# Patient Record
Sex: Male | Born: 1990 | Race: Black or African American | Hispanic: No | Marital: Single | Smoking: Former smoker
Health system: Southern US, Community
[De-identification: ages and names within clinical notes are randomized; demographics above are authoritative.]

## PROBLEM LIST (undated history)

## (undated) DIAGNOSIS — F12188 Cannabis abuse with other cannabis-induced disorder: Secondary | ICD-10-CM

## (undated) DIAGNOSIS — F1910 Other psychoactive substance abuse, uncomplicated: Secondary | ICD-10-CM

---
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
## 2014-07-23 ENCOUNTER — Ambulatory Visit (INDEPENDENT_AMBULATORY_CARE_PROVIDER_SITE_OTHER): Payer: 59

## 2014-07-23 ENCOUNTER — Ambulatory Visit (INDEPENDENT_AMBULATORY_CARE_PROVIDER_SITE_OTHER): Payer: 59 | Admitting: Family Medicine

## 2014-07-23 ENCOUNTER — Ambulatory Visit: Payer: Self-pay

## 2014-07-23 VITALS — BP 122/80 | HR 70 | Temp 98.1°F | Ht 65.0 in | Wt 135.2 lb

## 2014-07-23 DIAGNOSIS — M25532 Pain in left wrist: Secondary | ICD-10-CM

## 2014-07-23 DIAGNOSIS — W19XXXA Unspecified fall, initial encounter: Secondary | ICD-10-CM

## 2014-07-23 DIAGNOSIS — M79642 Pain in left hand: Secondary | ICD-10-CM

## 2014-07-23 MED ORDER — HYDROCODONE-ACETAMINOPHEN 5-325 MG PO TABS: 1.0000 | ORAL_TABLET | Freq: Four times a day (QID) | ORAL | Status: DC | PRN

## 2014-07-23 NOTE — Patient Instructions (Signed)
I do not see any broken bones tonight. As your hand and wrist are sore in multiple areas, splint was placed for now. Keep arm elevated, clean over abrasions with soap and water once per day, cover with polysporin or vaseline, and  then place splint back on. Recheck with Dr. Neva SeatGreene on Friday.  If any worsening of pain, fingertips cold or color changes - go to emergency room.
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
## 2019-05-20 ENCOUNTER — Emergency Department (HOSPITAL_BASED_OUTPATIENT_CLINIC_OR_DEPARTMENT_OTHER)
Admission: EM | Admit: 2019-05-20 | Discharge: 2019-05-20 | Disposition: A | Discharge status: Home / Self Care | Payer: Self-pay | Attending: Emergency Medicine | Admitting: Emergency Medicine

## 2019-05-20 ENCOUNTER — Encounter (HOSPITAL_BASED_OUTPATIENT_CLINIC_OR_DEPARTMENT_OTHER): Payer: Self-pay | Admitting: *Deleted

## 2019-05-20 ENCOUNTER — Other Ambulatory Visit: Payer: Self-pay

## 2019-05-20 DIAGNOSIS — K2900 Acute gastritis without bleeding: Secondary | ICD-10-CM

## 2019-05-20 DIAGNOSIS — Z20828 Contact with and (suspected) exposure to other viral communicable diseases: Secondary | ICD-10-CM | POA: Insufficient documentation

## 2019-05-20 DIAGNOSIS — R112 Nausea with vomiting, unspecified: Secondary | ICD-10-CM

## 2019-05-20 LAB — CBC WITH DIFFERENTIAL/PLATELET
Abs Immature Granulocytes: 0.01 10*3/uL (ref 0.00–0.07)
Basophils Absolute: 0 10*3/uL (ref 0.0–0.1)
Basophils Relative: 1 %
Eosinophils Absolute: 0.3 10*3/uL (ref 0.0–0.5)
Eosinophils Relative: 5 %
HCT: 50.2 % (ref 39.0–52.0)
Hemoglobin: 16.6 g/dL (ref 13.0–17.0)
Immature Granulocytes: 0 %
Lymphocytes Relative: 30 %
Lymphs Abs: 1.9 10*3/uL (ref 0.7–4.0)
MCH: 27.4 pg (ref 26.0–34.0)
MCHC: 33.1 g/dL (ref 30.0–36.0)
MCV: 83 fL (ref 80.0–100.0)
Monocytes Absolute: 0.4 10*3/uL (ref 0.1–1.0)
Monocytes Relative: 6 %
Neutro Abs: 3.6 10*3/uL (ref 1.7–7.7)
Neutrophils Relative %: 58 %
Platelets: 267 10*3/uL (ref 150–400)
RBC: 6.05 MIL/uL — ABNORMAL HIGH (ref 4.22–5.81)
RDW: 14.6 % (ref 11.5–15.5)
WBC: 6.2 10*3/uL (ref 4.0–10.5)
nRBC: 0 % (ref 0.0–0.2)

## 2019-05-20 LAB — COMPREHENSIVE METABOLIC PANEL
ALT: 18 U/L (ref 0–44)
AST: 27 U/L (ref 15–41)
Albumin: 4.7 g/dL (ref 3.5–5.0)
Alkaline Phosphatase: 83 U/L (ref 38–126)
Anion gap: 11 (ref 5–15)
BUN: 12 mg/dL (ref 6–20)
CO2: 27 mmol/L (ref 22–32)
Calcium: 9.9 mg/dL (ref 8.9–10.3)
Chloride: 101 mmol/L (ref 98–111)
Creatinine, Ser: 0.97 mg/dL (ref 0.61–1.24)
GFR calc Af Amer: >60 mL/min (ref >60)
GFR calc non Af Amer: >60 mL/min (ref >60)
Glucose, Bld: 98 mg/dL (ref 70–99)
Potassium: 3.4 mmol/L — ABNORMAL LOW (ref 3.5–5.1)
Sodium: 139 mmol/L (ref 135–145)
Total Bilirubin: 0.5 mg/dL (ref 0.3–1.2)
Total Protein: 8.5 g/dL — ABNORMAL HIGH (ref 6.5–8.1)

## 2019-05-20 LAB — LIPASE, BLOOD: Lipase: 87 U/L — ABNORMAL HIGH (ref 11–51)

## 2019-05-20 MED ORDER — HALOPERIDOL LACTATE 5 MG/ML IJ SOLN
5.0000 mg | Freq: Once | INTRAMUSCULAR | Status: AC
  Administered 2019-05-20: 15:00:00 5 mg via INTRAVENOUS
  Filled 2019-05-20: qty 1

## 2019-05-20 MED ORDER — SODIUM CHLORIDE 0.9 % IV BOLUS
1000.0000 mL | Freq: Once | INTRAVENOUS | Status: AC
  Administered 2019-05-20: 1000 mL via INTRAVENOUS

## 2019-05-20 MED ORDER — ONDANSETRON 4 MG PO TBDP: 4.0000 mg | ORAL_TABLET | Freq: Three times a day (TID) | ORAL | 0 refills | Status: DC | PRN

## 2019-05-20 MED ORDER — MORPHINE SULFATE (PF) 4 MG/ML IV SOLN: 4.0000 mg | Freq: Once | INTRAVENOUS | Status: DC

## 2019-05-20 MED ORDER — FAMOTIDINE 20 MG PO TABS: 20.0000 mg | ORAL_TABLET | Freq: Two times a day (BID) | ORAL | 0 refills | Status: DC

## 2019-05-20 MED ORDER — LORAZEPAM 2 MG/ML IJ SOLN
1.0000 mg | Freq: Once | INTRAMUSCULAR | Status: AC
  Administered 2019-05-20: 14:00:00 1 mg via INTRAVENOUS
  Filled 2019-05-20: qty 1

## 2019-05-20 MED ORDER — CAPSAICIN 0.025 % EX CREA
TOPICAL_CREAM | Freq: Once | CUTANEOUS | Status: AC
  Administered 2019-05-20: 15:00:00 via TOPICAL
  Filled 2019-05-20: qty 60

## 2019-05-20 MED ORDER — FAMOTIDINE IN NACL 20-0.9 MG/50ML-% IV SOLN
20.0000 mg | INTRAVENOUS | Status: AC
  Administered 2019-05-20: 20 mg via INTRAVENOUS
  Filled 2019-05-20: qty 50

## 2019-05-20 NOTE — ED Triage Notes (Signed)
Abdominal pain and vomiting. Mariajuana last used was yesterday.

## 2019-05-20 NOTE — ED Notes (Signed)
Pt resting quietly with eyes closed.  No further vomiting at this time.

## 2019-05-20 NOTE — ED Notes (Signed)
Pt tolerating po fluids, no further vomiting, mom here for ride home.

## 2019-05-20 NOTE — ED Notes (Signed)
Pt is uncooperative at triage.

## 2019-05-20 NOTE — ED Notes (Signed)
Pt. John Beltran he had already answered questions and will not answer questions asked by RN

## 2019-05-20 NOTE — ED Notes (Signed)
Pt. Actively vomiting at present time.

## 2019-05-20 NOTE — ED Provider Notes (Signed)
  Physical Exam  BP (!) 140/98   Pulse 89   Temp 98.5 F (36.9 C) (Oral)   Resp 14   Ht 5\' 7"  (1.702 m)   SpO2 100%   BMI 21.18 kg/m   Physical Exam  ED Course/Procedures     Procedures  MDM   Received care of patient at 3:30 PM from Dr. Rex Kras.  Please see her note for prior history, physical and care.  Briefly, this is a 28 year old male who presented with concern for nausea, vomiting abdominal pain.  Use marijuana yesterday.  Abdominal exam benign at this time, low suspicion for appendicitis, SBO, diverticulitis, pyelonephritis, cholecystitis.  Labs within normal limits, with lipase elevation that is not consistent with pancreatitis.  Given IV fluids, capsaicin, Ativan, and Haldol.  Plan is for reevaluation after these medications and p.o. challenge.  No focal abdominal pain on my repeat exam. Is tolerating po. Possible cannabinoid hyperemesis versus gastritis/viral etiology.  Given rx for zofran. COVID19 testing sent. Recommend supportive care.     John Morgan, MD 05/21/19 1258

## 2019-05-20 NOTE — ED Notes (Signed)
Pt. Reports he smokes a lot of mariajuana

## 2019-05-20 NOTE — ED Provider Notes (Signed)
[facility] EMERGENCY DEPARTMENT Provider Note   CSN: [id number] Arrival date & time: 05/20/19  1318     History Chief Complaint  Patient presents with  . Abdominal Pain  . Emesis    John Beltran is a 28 y.o. male.  28 year old male who presents with vomiting.  Patient states that he has a longstanding history of vomiting that has been getting worse recently.  He reports associated abdominal pain which he describes as "it feels like there is a rock in my stomach." He wonders whether pain is related to reflux or ulcers. Nothing makes symptoms better or worse. He does not take any medications for acid reflux.  He denies any associated diarrhea, cough, or fevers.  He denies alcohol or NSAID use but does admit to frequent marijuana use.  The history is provided by the patient.  Abdominal Pain Associated symptoms: vomiting   Emesis Associated symptoms: abdominal pain        History reviewed. No pertinent past medical history.  Patient Active Problem List   Diagnosis Date Noted  . ALLERGIC RHINITIS 09/29/2009    History reviewed. No pertinent surgical history.     No family history on file.  Social History   Tobacco Use  . Smoking status: Never Smoker  . Smokeless tobacco: Never Used  Substance Use Topics  . Alcohol use: No    Alcohol/week: 0.0 standard drinks  . Drug use: Yes    Types: Marijuana    Home Medications Prior to Admission medications   Medication Sig Start Date End Date Taking? Authorizing Provider  HYDROcodone-acetaminophen (NORCO/VICODIN) 5-325 MG per tablet Take 1-2 tablets by mouth every 6 (six) hours as needed for moderate pain. 07/23/14   Shade Flood, MD    Allergies    Patient has no known allergies.  Review of Systems   Review of Systems  Gastrointestinal: Positive for abdominal pain and vomiting.   All other systems reviewed and are negative except that which was mentioned in HPI  Physical Exam Updated Vital  Signs BP (!) 140/98   Pulse 89   Temp 98.5 F (36.9 C) (Oral)   Resp 14   Ht 5\' 7"  (1.702 m)   SpO2 100%   BMI 21.18 kg/m   Physical Exam Vitals and nursing note reviewed.  Constitutional:      Appearance: He is well-developed.     Comments: Uncomfortable, retching  HENT:     Head: Normocephalic and atraumatic.  Eyes:     Conjunctiva/sclera: Conjunctivae normal.     Pupils: Pupils are equal, round, and reactive to light.  Cardiovascular:     Rate and Rhythm: Normal rate and regular rhythm.     Pulses: Normal pulses.  Pulmonary:     Effort: Pulmonary effort is normal.  Abdominal:     General: Bowel sounds are normal. There is no distension.     Palpations: Abdomen is soft.     Tenderness: There is no abdominal tenderness.  Musculoskeletal:     Cervical back: Neck supple.  Skin:    General: Skin is warm and dry.  Neurological:     Mental Status: He is alert and oriented to person, place, and time.     Comments: Fluent speech  Psychiatric:        Judgment: Judgment normal.     ED Results / Procedures / Treatments   Labs (all labs ordered are listed, but only abnormal results are displayed) Labs Reviewed  COMPREHENSIVE METABOLIC PANEL -  Abnormal; Notable for the following components:      Result Value   Potassium 3.4 (*)    Total Protein 8.5 (*)    All other components within normal limits  LIPASE, BLOOD - Abnormal; Notable for the following components:   Lipase 87 (*)    All other components within normal limits  CBC WITH DIFFERENTIAL/PLATELET - Abnormal; Notable for the following components:   RBC 6.05 (*)    All other components within normal limits    EKG None  Radiology No results found.  Procedures Procedures (including critical care time)  Medications Ordered in ED Medications  famotidine (PEPCID) IVPB 20 mg premix (has no administration in time range)  LORazepam (ATIVAN) injection 1 mg (1 mg Intravenous Given 05/20/19 1427)  capsaicin  (ZOSTRIX) 0.025 % cream ( Topical Given 05/20/19 1433)  sodium chloride 0.9 % bolus 1,000 mL (1,000 mLs Intravenous New Bag/Given 05/20/19 1434)  haloperidol lactate (HALDOL) injection 5 mg (5 mg Intravenous Given 05/20/19 1454)    ED Course  I have reviewed the triage vital signs and the nursing notes.  Pertinent labs that were available during my care of the patient were reviewed by me and considered in my medical decision making (see chart for details).    MDM Rules/Calculators/A&P                       Pt vomiting during my initial exam. Given chronicity of symptoms, suspect condition such as gastritis or cyclical vomiting 2/2 marijuana rather than acute intraabdominal process such as appendicitis or bowel obstruction. Gave ativan and capsaicin as well as fluid bolus.  Patient continued to vomit therefore ordered IV Haldol followed by Pepcid.  On reassessment, the patient was resting comfortably.  He was not very cooperative with my abdominal exam but I was eventually able to examine abdomen and noted no focal areas of tenderness.  Lab work is overall reassuring with normal LFTs, normal CBC, normal creatinine.  Very mild elevation of lipase at 87.  Patient denies alcohol use.  Mom reports history of acid reflux problems and it is possible his symptoms are related to gastritis/PUD.  After patient has rested, we will attempt p.o. challenge.  I am signing out to oncoming provider pending reassessment and clinical improvement.  Final Clinical Impression(s) / ED Diagnoses Final diagnoses:  None    Rx / DC Orders ED Discharge Orders    None       Graceland Wachter, Wenda Overland, MD 05/20/19 1525

## 2019-05-20 NOTE — ED Notes (Signed)
Per mother Pt. Has been complaining of being extremely tired yesterday and the vomiting started today.

## 2019-05-21 ENCOUNTER — Emergency Department (HOSPITAL_COMMUNITY)
Admission: EM | Admit: 2019-05-21 | Discharge: 2019-05-21 | Disposition: A | Discharge status: Home / Self Care | Payer: Self-pay | Attending: Emergency Medicine | Admitting: Emergency Medicine

## 2019-05-21 ENCOUNTER — Encounter (HOSPITAL_COMMUNITY): Payer: Self-pay | Admitting: Emergency Medicine

## 2019-05-21 ENCOUNTER — Other Ambulatory Visit: Payer: Self-pay

## 2019-05-21 DIAGNOSIS — F1210 Cannabis abuse, uncomplicated: Secondary | ICD-10-CM | POA: Insufficient documentation

## 2019-05-21 DIAGNOSIS — R1013 Epigastric pain: Secondary | ICD-10-CM | POA: Insufficient documentation

## 2019-05-21 DIAGNOSIS — R112 Nausea with vomiting, unspecified: Secondary | ICD-10-CM | POA: Insufficient documentation

## 2019-05-21 MED ORDER — LIDOCAINE VISCOUS HCL 2 % MT SOLN
15.0000 mL | Freq: Once | OROMUCOSAL | Status: AC
  Administered 2019-05-21: 06:00:00 15 mL via ORAL
  Filled 2019-05-21: qty 15

## 2019-05-21 MED ORDER — DIPHENHYDRAMINE HCL 50 MG/ML IJ SOLN
25.0000 mg | Freq: Once | INTRAMUSCULAR | Status: AC
  Administered 2019-05-21: 25 mg via INTRAMUSCULAR
  Filled 2019-05-21: qty 1

## 2019-05-21 MED ORDER — DROPERIDOL 2.5 MG/ML IJ SOLN: 2.5000 mg | Freq: Once | INTRAMUSCULAR | Status: DC

## 2019-05-21 MED ORDER — ALUM & MAG HYDROXIDE-SIMETH 200-200-20 MG/5ML PO SUSP
30.0000 mL | Freq: Once | ORAL | Status: AC
  Administered 2019-05-21: 30 mL via ORAL
  Filled 2019-05-21: qty 30

## 2019-05-21 MED ORDER — DICYCLOMINE HCL 10 MG/5ML PO SOLN
10.0000 mg | Freq: Once | ORAL | Status: AC
  Administered 2019-05-21: 10 mg via ORAL
  Filled 2019-05-21: qty 5

## 2019-05-21 MED ORDER — DROPERIDOL 2.5 MG/ML IJ SOLN
1.2500 mg | Freq: Once | INTRAMUSCULAR | Status: AC
  Administered 2019-05-21: 1.25 mg via INTRAMUSCULAR
  Filled 2019-05-21: qty 2

## 2019-05-21 NOTE — Discharge Instructions (Signed)
Try zantac or pepcid twice a day.  Try to avoid things that may make this worse, most commonly these are spicy foods tomato based products fatty foods chocolate and peppermint.  Alcohol and tobacco can also make this worse.  Return to the emergency department for sudden worsening pain fever or inability to eat or drink. ° °

## 2019-05-21 NOTE — ED Triage Notes (Signed)
Patient is complaining of abdominal pain, nausea, and vomiting. Patient states that he ate a chicken sandwich yesterday and he think that is what started these symptoms.

## 2019-05-21 NOTE — ED Provider Notes (Signed)
[facility]-EMERGENCY DEPT Provider Note   CSN: [id number] Arrival date & time: 05/21/19  0436     History Chief Complaint  Patient presents with  . Abdominal Pain    John Beltran is a 28 y.o. male.  28 yo M with a chief complaints of epigastric abdominal pain nausea and vomiting.  Going on for a month or worsening over the past few days.  Was seen at [facility] about 7 hours ago.  Had lab work done and was discharged.  He says that nothing was done for him there.  He is requesting an x-ray of his abdomen.  The history is provided by the patient.  Abdominal Pain Pain location:  Epigastric Pain quality: sharp and shooting   Associated symptoms: nausea and vomiting   Associated symptoms: no chest pain, no chills, no diarrhea, no fever and no shortness of breath        History reviewed. No pertinent past medical history.  Patient Active Problem List   Diagnosis Date Noted  . ALLERGIC RHINITIS 09/29/2009    History reviewed. No pertinent surgical history.     History reviewed. No pertinent family history.  Social History   Tobacco Use  . Smoking status: Never Smoker  . Smokeless tobacco: Never Used  Substance Use Topics  . Alcohol use: No    Alcohol/week: 0.0 standard drinks  . Drug use: Yes    Types: Marijuana    Home Medications Prior to Admission medications   Medication Sig Start Date End Date Taking? Authorizing Provider  famotidine (PEPCID) 20 MG tablet Take 1 tablet (20 mg total) by mouth 2 (two) times daily. 05/20/19   Alvira Monday, MD  HYDROcodone-acetaminophen (NORCO/VICODIN) 5-325 MG per tablet Take 1-2 tablets by mouth every 6 (six) hours as needed for moderate pain. 07/23/14   Shade Flood, MD  ondansetron (ZOFRAN ODT) 4 MG disintegrating tablet Take 1 tablet (4 mg total) by mouth every 8 (eight) hours as needed for nausea or vomiting. 05/20/19   Alvira Monday, MD    Allergies    Patient has no  known allergies.  Review of Systems   Review of Systems  Constitutional: Negative for chills and fever.  HENT: Negative for congestion and facial swelling.   Eyes: Negative for discharge and visual disturbance.  Respiratory: Negative for shortness of breath.   Cardiovascular: Negative for chest pain and palpitations.  Gastrointestinal: Positive for abdominal pain, nausea and vomiting. Negative for diarrhea.  Musculoskeletal: Negative for arthralgias and myalgias.  Skin: Negative for color change and rash.  Neurological: Negative for tremors, syncope and headaches.  Psychiatric/Behavioral: Negative for confusion and dysphoric mood.    Physical Exam Updated Vital Signs BP 126/84 (BP Location: Right Arm)   Pulse 60   Temp 98 F (36.7 C) (Oral)   Resp 14   Ht 5\' 3"  (1.6 m)   Wt 77.1 kg   SpO2 100%   BMI 30.11 kg/m   Physical Exam Vitals and nursing note reviewed.  Constitutional:      Appearance: He is well-developed.  HENT:     Head: Normocephalic and atraumatic.  Eyes:     Pupils: Pupils are equal, round, and reactive to light.  Neck:     Vascular: No JVD.  Cardiovascular:     Rate and Rhythm: Normal rate and regular rhythm.     Heart sounds: No murmur. No friction rub. No gallop.   Pulmonary:     Effort: No respiratory  distress.     Breath sounds: No wheezing.  Abdominal:     General: There is no distension.     Tenderness: There is abdominal tenderness (worse to the epigastrium). There is no guarding or rebound.  Musculoskeletal:        General: Normal range of motion.     Cervical back: Normal range of motion and neck supple.  Skin:    Coloration: Skin is not pale.     Findings: No rash.  Neurological:     Mental Status: He is alert and oriented to person, place, and time.  Psychiatric:        Behavior: Behavior normal.     ED Results / Procedures / Treatments   Labs (all labs ordered are listed, but only abnormal results are displayed) Labs Reviewed -  No data to display  EKG None  Radiology No results found.  Procedures Procedures (including critical care time)  Medications Ordered in ED Medications  dicyclomine (BENTYL) 10 MG/5ML solution 10 mg (10 mg Oral Given 05/21/19 0613)  alum & mag hydroxide-simeth (MAALOX/MYLANTA) 200-200-20 MG/5ML suspension 30 mL (30 mLs Oral Given 05/21/19 0612)    And  lidocaine (XYLOCAINE) 2 % viscous mouth solution 15 mL (15 mLs Oral Given 05/21/19 0612)  diphenhydrAMINE (BENADRYL) injection 25 mg (25 mg Intramuscular Given 05/21/19 0613)  droperidol (INAPSINE) 2.5 MG/ML injection 1.25 mg (1.25 mg Intramuscular Given 05/21/19 2683)    ED Course  I have reviewed the triage vital signs and the nursing notes.  Pertinent labs & imaging results that were available during my care of the patient were reviewed by me and considered in my medical decision making (see chart for details).    MDM Rules/Calculators/A&P                      28 yo M with a chief complaints of epigastric abdominal pain nausea and vomiting.  Patient's was just seen at [facility].  Had a laboratory evaluation with mild elevation of the lipase.  He was able to tolerate p.o. and was discharged home.  Patient is requesting a CT scan.  Clinically I am not sure that 1 is indicated.  Patient has had pain for greater than a month, history of significant marijuana abuse.  Has had chronic nausea and vomiting.  Without a significant lab abnormality I think his best course of action would be to see a gastroenterologist as an outpatient.  He has not had any vomiting here.  We will give him a dose of nausea medicine, GI cocktail reassess.  Patient tolerating PO.  Symptoms improved.  D/c home.   6:59 AM:  I have discussed the diagnosis/risks/treatment options with the patient and believe the pt to be eligible for discharge home to follow-up with PCP. We also discussed returning to the ED immediately if new or worsening sx occur. We  discussed the sx which are most concerning (e.g., sudden worsening pain, fever, inability to tolerate by mouth) that necessitate immediate return. Medications administered to the patient during their visit and any new prescriptions provided to the patient are listed below.  Medications given during this visit Medications  dicyclomine (BENTYL) 10 MG/5ML solution 10 mg (10 mg Oral Given 05/21/19 0613)  alum & mag hydroxide-simeth (MAALOX/MYLANTA) 200-200-20 MG/5ML suspension 30 mL (30 mLs Oral Given 05/21/19 0612)    And  lidocaine (XYLOCAINE) 2 % viscous mouth solution 15 mL (15 mLs Oral Given 05/21/19 0612)  diphenhydrAMINE (BENADRYL) injection  25 mg (25 mg Intramuscular Given 05/21/19 0613)  droperidol (INAPSINE) 2.5 MG/ML injection 1.25 mg (1.25 mg Intramuscular Given 05/21/19 [id number])     The patient appears reasonably screen and/or stabilized for discharge and I doubt any other medical condition or other [facility] requiring further screening, evaluation, or treatment in the ED at this time prior to discharge.   Final Clinical Impression(s) / ED Diagnoses Final diagnoses:  Epigastric pain    Rx / DC Orders ED Discharge Orders    None       Melene PlanFloyd, Armanda Forand, DO 05/21/19 0700

## 2019-05-22 ENCOUNTER — Ambulatory Visit: Payer: Self-pay | Admitting: *Deleted

## 2019-05-22 LAB — NOVEL CORONAVIRUS, NAA (HOSP ORDER, SEND-OUT TO REF LAB; TAT 18-24 HRS): SARS-CoV-2, NAA: NOT DETECTED

## 2019-05-22 NOTE — Telephone Encounter (Signed)
Patient seen at the ED twice in the last 3 days for vomiting. Call is from his mother. She reported he had a significant amount of marijuana prior to the vomiting that began Monday. Complaining of abdominal pain near the umbilicus since the vomiting started. He was discharged from ED with anti-nausea medications which she gave him last night and this morning. Reported his vomiting eased some earlier today and now has began again. Denies blood/coffee ground appearance in emesis. Denies diarrhea. Care Advice including only sips of Pedialyte per hour for the next 12 hours, no solids at all to let his gut rest. Stretch out and deep breath often today to relax his abdomen muscles from all the vomiting. If no improvement over 24 hours or worsens, seek treatment again at [facility] or ED. Call back if needed, we will try to establish a pcp for patient. Reviewed urgent symptoms to call back with or seek immediate treatment at the ED.   Reason for Disposition . Alcohol or drug abuse, known or suspected . Vomiting is a chronic symptom (recurrent or ongoing AND present > 4 weeks)  Answer Assessment - Initial Assessment Questions 1. VOMITING SEVERITY: "How many times have you vomited in the past 24 hours?"     - MILD:  1 - 2 times/day    - MODERATE: 3 - 5 times/day, decreased oral intake without significant weight loss or symptoms of dehydration    - SEVERE: 6 or more times/day, vomits everything or nearly everything, with significant weight loss, symptoms of dehydration     2. ONSET: "When did the vomiting begin?"     moderate 3. FLUIDS: "What fluids or food have you vomited up today?" "Have you been able to keep any fluids down?"    Crackers and fluids 4. ABDOMINAL PAIN: "Are your having any abdominal pain?" If yes : "How bad is it and what does it feel like?" (e.g., crampy, dull, intermittent, constant)      Pain near the umbilicus  5. DIARRHEA: "Is there any diarrhea?" If so, ask: "How many times today?"      none 6. CONTACTS: "Is there anyone else in the family with the same symptoms?"      none 7. CAUSE: "What do you think is causing your vomiting?"    Possible marijuana use 8. HYDRATION STATUS: "Any signs of dehydration?" (e.g., dry mouth [not only dry lips], too weak to stand) "When did you last urinate?"     unsure 9. OTHER SYMPTOMS: "Do you have any other symptoms?" (e.g., fever, headache, vertigo, vomiting blood or coffee grounds, recent head injury)     no 10. PREGNANCY: "Is there any chance you are pregnant?" "When was your last menstrual period?"       na  Protocols used: [facility]

## 2019-05-25 ENCOUNTER — Encounter (HOSPITAL_BASED_OUTPATIENT_CLINIC_OR_DEPARTMENT_OTHER): Payer: Self-pay | Admitting: Emergency Medicine

## 2019-05-25 ENCOUNTER — Emergency Department (HOSPITAL_BASED_OUTPATIENT_CLINIC_OR_DEPARTMENT_OTHER)
Admission: EM | Admit: 2019-05-25 | Discharge: 2019-05-25 | Disposition: A | Discharge status: Home / Self Care | Payer: Self-pay | Attending: Emergency Medicine | Admitting: Emergency Medicine

## 2019-05-25 ENCOUNTER — Other Ambulatory Visit: Payer: Self-pay

## 2019-05-25 DIAGNOSIS — E860 Dehydration: Secondary | ICD-10-CM | POA: Insufficient documentation

## 2019-05-25 DIAGNOSIS — R112 Nausea with vomiting, unspecified: Secondary | ICD-10-CM | POA: Insufficient documentation

## 2019-05-25 LAB — URINALYSIS, ROUTINE W REFLEX MICROSCOPIC
Bilirubin Urine: NEGATIVE
Glucose, UA: NEGATIVE mg/dL
Hgb urine dipstick: NEGATIVE
Ketones, ur: 40 mg/dL — AB
Leukocytes,Ua: NEGATIVE
Nitrite: NEGATIVE
Protein, ur: NEGATIVE mg/dL
Specific Gravity, Urine: 1.015 (ref 1.005–1.030)
pH: 7 (ref 5.0–8.0)

## 2019-05-25 LAB — BASIC METABOLIC PANEL
Anion gap: 11 (ref 5–15)
BUN: 33 mg/dL — ABNORMAL HIGH (ref 6–20)
CO2: 31 mmol/L (ref 22–32)
Calcium: 8.9 mg/dL (ref 8.9–10.3)
Chloride: 91 mmol/L — ABNORMAL LOW (ref 98–111)
Creatinine, Ser: 1.28 mg/dL — ABNORMAL HIGH (ref 0.61–1.24)
GFR calc Af Amer: >60 mL/min (ref >60)
GFR calc non Af Amer: >60 mL/min (ref >60)
Glucose, Bld: 92 mg/dL (ref 70–99)
Potassium: 3.1 mmol/L — ABNORMAL LOW (ref 3.5–5.1)
Sodium: 133 mmol/L — ABNORMAL LOW (ref 135–145)

## 2019-05-25 LAB — CBC WITH DIFFERENTIAL/PLATELET
Abs Immature Granulocytes: 0.01 10*3/uL (ref 0.00–0.07)
Basophils Absolute: 0 10*3/uL (ref 0.0–0.1)
Basophils Relative: 0 %
Eosinophils Absolute: 0 10*3/uL (ref 0.0–0.5)
Eosinophils Relative: 0 %
HCT: 57.7 % — ABNORMAL HIGH (ref 39.0–52.0)
Hemoglobin: 19.4 g/dL — ABNORMAL HIGH (ref 13.0–17.0)
Immature Granulocytes: 0 %
Lymphocytes Relative: 29 %
Lymphs Abs: 1.6 10*3/uL (ref 0.7–4.0)
MCH: 26.9 pg (ref 26.0–34.0)
MCHC: 33.6 g/dL (ref 30.0–36.0)
MCV: 80.1 fL (ref 80.0–100.0)
Monocytes Absolute: 0.6 10*3/uL (ref 0.1–1.0)
Monocytes Relative: 11 %
Neutro Abs: 3.3 10*3/uL (ref 1.7–7.7)
Neutrophils Relative %: 60 %
Platelets: 308 10*3/uL (ref 150–400)
RBC: 7.2 MIL/uL — ABNORMAL HIGH (ref 4.22–5.81)
RDW: 14.6 % (ref 11.5–15.5)
WBC: 5.6 10*3/uL (ref 4.0–10.5)
nRBC: 0 % (ref 0.0–0.2)

## 2019-05-25 LAB — COMPREHENSIVE METABOLIC PANEL
ALT: 20 U/L (ref 0–44)
AST: 26 U/L (ref 15–41)
Albumin: 5.1 g/dL — ABNORMAL HIGH (ref 3.5–5.0)
Alkaline Phosphatase: 88 U/L (ref 38–126)
Anion gap: >20 — ABNORMAL HIGH (ref 5–15)
BUN: 45 mg/dL — ABNORMAL HIGH (ref 6–20)
CO2: 30 mmol/L (ref 22–32)
Calcium: 9.9 mg/dL (ref 8.9–10.3)
Chloride: 82 mmol/L — ABNORMAL LOW (ref 98–111)
Creatinine, Ser: 1.67 mg/dL — ABNORMAL HIGH (ref 0.61–1.24)
GFR calc Af Amer: >60 mL/min (ref >60)
GFR calc non Af Amer: 56 mL/min — ABNORMAL LOW (ref >60)
Glucose, Bld: 106 mg/dL — ABNORMAL HIGH (ref 70–99)
Potassium: 3.1 mmol/L — ABNORMAL LOW (ref 3.5–5.1)
Sodium: 133 mmol/L — ABNORMAL LOW (ref 135–145)
Total Bilirubin: 2.1 mg/dL — ABNORMAL HIGH (ref 0.3–1.2)
Total Protein: 9.1 g/dL — ABNORMAL HIGH (ref 6.5–8.1)

## 2019-05-25 LAB — LIPASE, BLOOD: Lipase: 19 U/L (ref 11–51)

## 2019-05-25 LAB — TROPONIN I (HIGH SENSITIVITY): Troponin I (High Sensitivity): 5 ng/L (ref <18)

## 2019-05-25 MED ORDER — LACTATED RINGERS IV BOLUS
1000.0000 mL | Freq: Once | INTRAVENOUS | Status: AC
  Administered 2019-05-25: 10:00:00 1000 mL via INTRAVENOUS

## 2019-05-25 MED ORDER — DROPERIDOL 2.5 MG/ML IJ SOLN: 2.5000 mg | Freq: Once | INTRAMUSCULAR | Status: DC

## 2019-05-25 MED ORDER — SODIUM CHLORIDE 0.9 % IV BOLUS
1000.0000 mL | Freq: Once | INTRAVENOUS | Status: AC
  Administered 2019-05-25: 09:00:00 1000 mL via INTRAVENOUS

## 2019-05-25 MED ORDER — POTASSIUM CHLORIDE CRYS ER 20 MEQ PO TBCR
40.0000 meq | EXTENDED_RELEASE_TABLET | Freq: Once | ORAL | Status: AC
  Administered 2019-05-25: 14:00:00 40 meq via ORAL
  Filled 2019-05-25: qty 2

## 2019-05-25 MED ORDER — CAPSAICIN 0.075 % EX CREA: TOPICAL_CREAM | Freq: Two times a day (BID) | CUTANEOUS | Status: DC

## 2019-05-25 MED ORDER — ONDANSETRON HCL 4 MG PO TABS: 4.0000 mg | ORAL_TABLET | Freq: Four times a day (QID) | ORAL | 0 refills | Status: DC

## 2019-05-25 MED ORDER — ONDANSETRON HCL 4 MG/2ML IJ SOLN
4.0000 mg | Freq: Once | INTRAMUSCULAR | Status: AC
  Administered 2019-05-25: 4 mg via INTRAVENOUS
  Filled 2019-05-25: qty 2

## 2019-05-25 NOTE — ED Provider Notes (Signed)
[facility] EMERGENCY DEPARTMENT Provider Note   CSN: [id number] Arrival date & time: 05/25/19  0757     History Chief Complaint  Patient presents with  . Abdominal Pain  . Emesis    John Beltran is a 28 y.o. male.  The history is provided by the patient and medical records. No language interpreter was used.  Abdominal Pain Associated symptoms: vomiting   Emesis Associated symptoms: abdominal pain    John Beltran is a 28 y.o. male who presents to the Emergency Department complaining of abdominal pain and vomiting. He presents the emergency department complaining of five days of nausea, vomiting and generalized abdominal pain. Symptoms began abruptly after eating [employer] sandwich. He reports vomiting up to 20 times daily. He has decreased urinary and stool output. No fevers. He does report using marijuana but has not used for the last five days. He also takes occasional oxycodone's and did take one 5 days ago. No IV drug use. No alcohol use. He states that sitting in a hot tub does help his pain. He has no medical problems and takes no medications. No prior abdominal surgeries. Recently had negative covid test. No cough, sob.  No loss of taste/smell.      History reviewed. No pertinent past medical history.  Patient Active Problem List   Diagnosis Date Noted  . ALLERGIC RHINITIS 09/29/2009    History reviewed. No pertinent surgical history.     No family history on file.  Social History   Tobacco Use  . Smoking status: Never Smoker  . Smokeless tobacco: Never Used  Substance Use Topics  . Alcohol use: No    Alcohol/week: 0.0 standard drinks  . Drug use: Yes    Types: Marijuana    Home Medications Prior to Admission medications   Medication Sig Start Date End Date Taking? Authorizing Provider  famotidine (PEPCID) 20 MG tablet Take 1 tablet (20 mg total) by mouth 2 (two) times daily. 05/20/19   Alvira MondaySchlossman, Erin, MD  ondansetron (ZOFRAN) 4 MG tablet  Take 1 tablet (4 mg total) by mouth every 6 (six) hours. 05/25/19   Tilden Fossaees, Philana Younis, MD    Allergies    Patient has no known allergies.  Review of Systems   Review of Systems  Gastrointestinal: Positive for abdominal pain and vomiting.  All other systems reviewed and are negative.   Physical Exam Updated Vital Signs BP 129/81   Pulse 70   Temp (!) 97.5 F (36.4 C) (Oral)   Resp 13   Ht 5\' 6"  (1.676 m)   Wt 68 kg Comment: Pt. was not sure of their weight  SpO2 100%   BMI 24.21 kg/m   Physical Exam Vitals and nursing note reviewed.  Constitutional:      Appearance: He is well-developed.  HENT:     Head: Normocephalic and atraumatic.  Cardiovascular:     Rate and Rhythm: Normal rate and regular rhythm.  Pulmonary:     Effort: Pulmonary effort is normal. No respiratory distress.  Abdominal:     Palpations: Abdomen is soft.     Tenderness: There is no guarding or rebound.     Comments: Mild generalized abdominal tenderness  Musculoskeletal:        General: No tenderness.  Skin:    General: Skin is warm and dry.  Neurological:     Mental Status: He is alert and oriented to person, place, and time.  Psychiatric:        Behavior: Behavior  normal.     ED Results / Procedures / Treatments   Labs (all labs ordered are listed, but only abnormal results are displayed) Labs Reviewed  COMPREHENSIVE METABOLIC PANEL - Abnormal; Notable for the following components:      Result Value   Sodium 133 (*)    Potassium 3.1 (*)    Chloride 82 (*)    Glucose, Bld 106 (*)    BUN 45 (*)    Creatinine, Ser 1.67 (*)    Total Protein 9.1 (*)    Albumin 5.1 (*)    Total Bilirubin 2.1 (*)    GFR calc non Af Amer 56 (*)    Anion gap >20 (*)    All other components within normal limits  CBC WITH DIFFERENTIAL/PLATELET - Abnormal; Notable for the following components:   RBC 7.20 (*)    Hemoglobin 19.4 (*)    HCT 57.7 (*)    All other components within normal limits  URINALYSIS,  ROUTINE W REFLEX MICROSCOPIC - Abnormal; Notable for the following components:   APPearance HAZY (*)    Ketones, ur 40 (*)    All other components within normal limits  BASIC METABOLIC PANEL - Abnormal; Notable for the following components:   Sodium 133 (*)    Potassium 3.1 (*)    Chloride 91 (*)    BUN 33 (*)    Creatinine, Ser 1.28 (*)    All other components within normal limits  LIPASE, BLOOD  TROPONIN I (HIGH SENSITIVITY)    EKG EKG Interpretation  Date/Time:  Saturday May 25 2019 08:48:43 EST Ventricular Rate:  72 PR Interval:    QRS Duration: 95 QT Interval:  413 QTC Calculation: 452 R Axis:   93 Text Interpretation: Sinus rhythm Right atrial enlargement Borderline right axis deviation ST elev, probable normal early repol pattern Confirmed by Quintella Reichert [id number]) on 05/25/2019 8:54:26 AM   Radiology No results found.  Procedures Procedures (including critical care time)  Medications Ordered in ED Medications  capsicum (ZOSTRIX) 0.075 % cream (has no administration in time range)  sodium chloride 0.9 % bolus 1,000 mL (0 mLs Intravenous Stopped 05/25/19 1012)  lactated ringers bolus 1,000 mL (0 mLs Intravenous Stopped 05/25/19 1119)  ondansetron (ZOFRAN) injection 4 mg (4 mg Intravenous Given 05/25/19 1009)  potassium chloride SA (KLOR-CON) CR tablet 40 mEq (40 mEq Oral Given 05/25/19 1345)    ED Course  I have reviewed the triage vital signs and the nursing notes.  Pertinent labs & imaging results that were available during my care of the patient were reviewed by me and considered in my medical decision making (see chart for details).    MDM Rules/Calculators/A&P                      Patient here for evaluation of nausea, vomiting or the last week. He is dehydrated but non-toxic appearing on evaluation with minimal abdominal tenderness. He was treated with IV fluids and antiemetics with improvement in his symptoms. He was able to tolerate oral fluids  in the emergency department. Labs are significant for acute kidney injury with hypokalemia and elevation is BUN and creatinine. On repeat assessment following IV fluid hydration BMP is improving. Counseled patient on home care for nausea, vomiting and dehydration. Also discussed importance of return precautions. Final Clinical Impression(s) / ED Diagnoses Final diagnoses:  Dehydration  Non-intractable vomiting with nausea, unspecified vomiting type    Rx / DC Orders ED Discharge Orders  Ordered    ondansetron (ZOFRAN) 4 MG tablet  Every 6 hours     05/25/19 1401           Tilden Fossa, MD 05/25/19 1413

## 2019-05-25 NOTE — ED Triage Notes (Signed)
Pt reports abdominal pain x 1 week with N/V. Pt seen at [facility] for same symptoms, reports no improvement.

## 2019-05-25 NOTE — ED Notes (Signed)
PT REPORTS THAT HE IS OUT OF ZOFRAN PRESCRIPTION.

## 2019-05-27 ENCOUNTER — Encounter (HOSPITAL_COMMUNITY): Payer: Self-pay

## 2019-05-27 DIAGNOSIS — Z5321 Procedure and treatment not carried out due to patient leaving prior to being seen by health care provider: Secondary | ICD-10-CM | POA: Insufficient documentation

## 2019-05-27 NOTE — ED Triage Notes (Signed)
Pt had been seen for the same three times this week, he states that he's unable to keep anything down, denies any flank pain or urinary trouble

## 2019-05-28 ENCOUNTER — Emergency Department (HOSPITAL_COMMUNITY)
Admission: EM | Admit: 2019-05-28 | Discharge: 2019-05-28 | Discharge status: Against Medical Advice | Payer: Self-pay | Attending: Emergency Medicine | Admitting: Emergency Medicine

## 2019-05-28 NOTE — ED Triage Notes (Signed)
Pt called from triage with no answer 

## 2019-06-13 ENCOUNTER — Emergency Department (HOSPITAL_BASED_OUTPATIENT_CLINIC_OR_DEPARTMENT_OTHER): Payer: Self-pay

## 2019-06-13 ENCOUNTER — Other Ambulatory Visit: Payer: Self-pay

## 2019-06-13 ENCOUNTER — Observation Stay (HOSPITAL_BASED_OUTPATIENT_CLINIC_OR_DEPARTMENT_OTHER)
Admission: EM | Admit: 2019-06-13 | Discharge: 2019-06-14 | Discharge status: Against Medical Advice | Payer: Self-pay | Attending: Internal Medicine | Admitting: Internal Medicine

## 2019-06-13 ENCOUNTER — Encounter (HOSPITAL_BASED_OUTPATIENT_CLINIC_OR_DEPARTMENT_OTHER): Payer: Self-pay | Admitting: Emergency Medicine

## 2019-06-13 DIAGNOSIS — Z5321 Procedure and treatment not carried out due to patient leaving prior to being seen by health care provider: Secondary | ICD-10-CM | POA: Insufficient documentation

## 2019-06-13 DIAGNOSIS — E860 Dehydration: Principal | ICD-10-CM | POA: Insufficient documentation

## 2019-06-13 DIAGNOSIS — R112 Nausea with vomiting, unspecified: Secondary | ICD-10-CM | POA: Diagnosis present

## 2019-06-13 DIAGNOSIS — Z20822 Contact with and (suspected) exposure to covid-19: Secondary | ICD-10-CM | POA: Insufficient documentation

## 2019-06-13 DIAGNOSIS — N179 Acute kidney failure, unspecified: Secondary | ICD-10-CM | POA: Insufficient documentation

## 2019-06-13 HISTORY — DX: Other psychoactive substance abuse, uncomplicated: F19.10

## 2019-06-13 LAB — COMPREHENSIVE METABOLIC PANEL
ALT: 28 U/L (ref 0–44)
AST: 35 U/L (ref 15–41)
Albumin: 5.4 g/dL — ABNORMAL HIGH (ref 3.5–5.0)
Alkaline Phosphatase: 85 U/L (ref 38–126)
Anion gap: 20 — ABNORMAL HIGH (ref 5–15)
BUN: 39 mg/dL — ABNORMAL HIGH (ref 6–20)
CO2: 22 mmol/L (ref 22–32)
Calcium: 10.3 mg/dL (ref 8.9–10.3)
Chloride: 92 mmol/L — ABNORMAL LOW (ref 98–111)
Creatinine, Ser: 2.06 mg/dL — ABNORMAL HIGH (ref 0.61–1.24)
GFR calc Af Amer: 50 mL/min — ABNORMAL LOW (ref >60)
GFR calc non Af Amer: 43 mL/min — ABNORMAL LOW (ref >60)
Glucose, Bld: 98 mg/dL (ref 70–99)
Potassium: 4 mmol/L (ref 3.5–5.1)
Sodium: 134 mmol/L — ABNORMAL LOW (ref 135–145)
Total Bilirubin: 0.8 mg/dL (ref 0.3–1.2)
Total Protein: 9.8 g/dL — ABNORMAL HIGH (ref 6.5–8.1)

## 2019-06-13 LAB — URINALYSIS, MICROSCOPIC (REFLEX)

## 2019-06-13 LAB — URINALYSIS, ROUTINE W REFLEX MICROSCOPIC
Glucose, UA: NEGATIVE mg/dL
Hgb urine dipstick: NEGATIVE
Ketones, ur: 15 mg/dL — AB
Leukocytes,Ua: NEGATIVE
Nitrite: NEGATIVE
Protein, ur: 30 mg/dL — AB
Specific Gravity, Urine: >1.03 — ABNORMAL HIGH (ref 1.005–1.030)
pH: 5.5 (ref 5.0–8.0)

## 2019-06-13 LAB — CBC WITH DIFFERENTIAL/PLATELET
Abs Immature Granulocytes: 0.02 10*3/uL (ref 0.00–0.07)
Basophils Absolute: 0 10*3/uL (ref 0.0–0.1)
Basophils Relative: 0 %
Eosinophils Absolute: 0 10*3/uL (ref 0.0–0.5)
Eosinophils Relative: 0 %
HCT: 56.2 % — ABNORMAL HIGH (ref 39.0–52.0)
Hemoglobin: 18.9 g/dL — ABNORMAL HIGH (ref 13.0–17.0)
Immature Granulocytes: 0 %
Lymphocytes Relative: 21 %
Lymphs Abs: 1.8 10*3/uL (ref 0.7–4.0)
MCH: 27.4 pg (ref 26.0–34.0)
MCHC: 33.6 g/dL (ref 30.0–36.0)
MCV: 81.4 fL (ref 80.0–100.0)
Monocytes Absolute: 0.8 10*3/uL (ref 0.1–1.0)
Monocytes Relative: 9 %
Neutro Abs: 5.9 10*3/uL (ref 1.7–7.7)
Neutrophils Relative %: 70 %
Platelets: 305 10*3/uL (ref 150–400)
RBC: 6.9 MIL/uL — ABNORMAL HIGH (ref 4.22–5.81)
RDW: 15.3 % (ref 11.5–15.5)
WBC: 8.6 10*3/uL (ref 4.0–10.5)
nRBC: 0 % (ref 0.0–0.2)

## 2019-06-13 LAB — RAPID URINE DRUG SCREEN, HOSP PERFORMED
Amphetamines: NOT DETECTED
Barbiturates: NOT DETECTED
Benzodiazepines: POSITIVE — AB
Cocaine: POSITIVE — AB
Opiates: NOT DETECTED
Tetrahydrocannabinol: POSITIVE — AB

## 2019-06-13 LAB — LIPASE, BLOOD: Lipase: 19 U/L (ref 11–51)

## 2019-06-13 MED ORDER — SODIUM CHLORIDE 0.9 % IV BOLUS
1000.0000 mL | Freq: Once | INTRAVENOUS | Status: AC
  Administered 2019-06-13: 21:00:00 1000 mL via INTRAVENOUS

## 2019-06-13 MED ORDER — PANTOPRAZOLE SODIUM 40 MG IV SOLR
40.0000 mg | Freq: Once | INTRAVENOUS | Status: AC
  Administered 2019-06-13: 21:00:00 40 mg via INTRAVENOUS
  Filled 2019-06-13: qty 40

## 2019-06-13 MED ORDER — HALOPERIDOL LACTATE 5 MG/ML IJ SOLN
5.0000 mg | Freq: Once | INTRAMUSCULAR | Status: AC
  Administered 2019-06-13: 5 mg via INTRAVENOUS
  Filled 2019-06-13: qty 1

## 2019-06-13 MED ORDER — CAPSAICIN 0.025 % EX CREA
TOPICAL_CREAM | Freq: Once | CUTANEOUS | Status: DC
  Filled 2019-06-13: qty 60

## 2019-06-13 MED ORDER — ONDANSETRON HCL 4 MG/2ML IJ SOLN
4.0000 mg | Freq: Once | INTRAMUSCULAR | Status: AC
  Administered 2019-06-13: 21:00:00 4 mg via INTRAVENOUS
  Filled 2019-06-13: qty 2

## 2019-06-13 MED ORDER — DEXTROSE-NACL 5-0.9 % IV SOLN: Freq: Once | INTRAVENOUS | Status: AC

## 2019-06-13 NOTE — ED Notes (Signed)
Patient requesting PO fluids; also states he feels better after NS IV flush. No active vomiting noted at this time; states vomiting PTA in yellowish color.

## 2019-06-13 NOTE — ED Notes (Signed)
Patient transported to CT 

## 2019-06-13 NOTE — ED Provider Notes (Signed)
[facility] EMERGENCY DEPARTMENT Provider Note   CSN: [id number] Arrival date & time: 06/13/19  1851     History Chief Complaint  Patient presents with  . Emesis    Tylen A Canada is a 29 y.o. male.  Patient is a 29 year old male who presents with vomiting and abdominal pain.  He states he started having vomiting in December.  He initially denied that he had an issue before that although per prior ED notes, he has had some recurrent vomiting prior to this.  However he tells me they just started having issues in December and has had about 2-3 episodes where he cannot stop vomiting.  His most recent episode started last night.  He has not been able to keep anything down all day.  His emesis is nonbilious and nonbloody.  He denies any associated diarrhea.  He has had some decreased urination.  He has pain across the top of his abdomen.  No known fevers.  He does admit to prior marijuana use but states he stopped in December when the vomiting had started.  He denies any alcohol or other drug use.        History reviewed. No pertinent past medical history.  Patient Active Problem List   Diagnosis Date Noted  . Nausea & vomiting 06/13/2019  . ALLERGIC RHINITIS 09/29/2009    History reviewed. No pertinent surgical history.     No family history on file.  Social History   Tobacco Use  . Smoking status: Never Smoker  . Smokeless tobacco: Never Used  Substance Use Topics  . Alcohol use: No    Alcohol/week: 0.0 standard drinks  . Drug use: Yes    Types: Marijuana    Home Medications Prior to Admission medications   Medication Sig Start Date End Date Taking? Authorizing Provider  famotidine (PEPCID) 20 MG tablet Take 1 tablet (20 mg total) by mouth 2 (two) times daily. 05/20/19   Alvira Monday, MD  ondansetron (ZOFRAN) 4 MG tablet Take 1 tablet (4 mg total) by mouth every 6 (six) hours. 05/25/19   Tilden Fossa, MD    Allergies    Patient has no known  allergies.  Review of Systems   Review of Systems  Constitutional: Negative for chills, diaphoresis, fatigue and fever.  HENT: Negative for congestion, rhinorrhea and sneezing.   Eyes: Negative.   Respiratory: Negative for cough, chest tightness and shortness of breath.   Cardiovascular: Negative for chest pain and leg swelling.  Gastrointestinal: Positive for abdominal pain, nausea and vomiting. Negative for blood in stool and diarrhea.  Genitourinary: Positive for decreased urine volume. Negative for difficulty urinating, flank pain, frequency and hematuria.  Musculoskeletal: Negative for arthralgias and back pain.  Skin: Negative for rash.  Neurological: Negative for dizziness, speech difficulty, weakness, numbness and headaches.    Physical Exam Updated Vital Signs BP (!) 119/94   Pulse (!) 58   Temp 98.9 F (37.2 C) (Oral)   Resp 18   Ht 5\' 6"  (1.676 m)   Wt 68 kg   SpO2 98%   BMI 24.21 kg/m   Physical Exam Constitutional:      Appearance: He is well-developed.  HENT:     Head: Normocephalic and atraumatic.  Eyes:     Pupils: Pupils are equal, round, and reactive to light.  Cardiovascular:     Rate and Rhythm: Normal rate and regular rhythm.     Heart sounds: Normal heart sounds.  Pulmonary:     Effort:  Pulmonary effort is normal. No respiratory distress.     Breath sounds: Normal breath sounds. No wheezing or rales.  Chest:     Chest wall: No tenderness.  Abdominal:     General: Bowel sounds are normal.     Palpations: Abdomen is soft.     Tenderness: There is abdominal tenderness (Tenderness across the upper abdomen). There is no guarding or rebound.  Musculoskeletal:        General: Normal range of motion.     Cervical back: Normal range of motion and neck supple.  Lymphadenopathy:     Cervical: No cervical adenopathy.  Skin:    General: Skin is warm and dry.     Findings: No rash.  Neurological:     Mental Status: He is alert and oriented to person,  place, and time.     ED Results / Procedures / Treatments   Labs (all labs ordered are listed, but only abnormal results are displayed) Labs Reviewed  CBC WITH DIFFERENTIAL/PLATELET - Abnormal; Notable for the following components:      Result Value   RBC 6.90 (*)    Hemoglobin 18.9 (*)    HCT 56.2 (*)    All other components within normal limits  URINALYSIS, ROUTINE W REFLEX MICROSCOPIC - Abnormal; Notable for the following components:   Specific Gravity, Urine >1.030 (*)    Bilirubin Urine SMALL (*)    Ketones, ur 15 (*)    Protein, ur 30 (*)    All other components within normal limits  COMPREHENSIVE METABOLIC PANEL - Abnormal; Notable for the following components:   Sodium 134 (*)    Chloride 92 (*)    BUN 39 (*)    Creatinine, Ser 2.06 (*)    Total Protein 9.8 (*)    Albumin 5.4 (*)    GFR calc non Af Amer 43 (*)    GFR calc Af Amer 50 (*)    Anion gap 20 (*)    All other components within normal limits  RAPID URINE DRUG SCREEN, HOSP PERFORMED - Abnormal; Notable for the following components:   Cocaine POSITIVE (*)    Benzodiazepines POSITIVE (*)    Tetrahydrocannabinol POSITIVE (*)    All other components within normal limits  URINALYSIS, MICROSCOPIC (REFLEX) - Abnormal; Notable for the following components:   Bacteria, UA MANY (*)    All other components within normal limits  SARS CORONAVIRUS 2 (TAT 6-24 HRS)  URINE CULTURE  LIPASE, BLOOD    EKG None  Radiology CT ABDOMEN PELVIS WO CONTRAST  Result Date: 06/13/2019 CLINICAL DATA:  Abdominal pain, persistent vomiting EXAM: CT ABDOMEN AND PELVIS WITHOUT CONTRAST TECHNIQUE: Multidetector CT imaging of the abdomen and pelvis was performed following the standard protocol without IV contrast. COMPARISON:  Abdominal radiograph 03/07/2008 FINDINGS: Lower chest: Lung bases are clear. Normal heart size. No pericardial effusion. Hepatobiliary: No focal liver abnormality is seen. No gallstones, gallbladder wall thickening,  or biliary dilatation. Pancreas: Unremarkable. No pancreatic ductal dilatation or surrounding inflammatory changes. Spleen: Normal in size without focal abnormality. Adrenals/Urinary Tract: Adrenal glands are unremarkable. Kidneys are normal, without renal calculi, focal lesion, or hydronephrosis. Mild bladder wall thickening with some gradient attenuation towards the posterior bladder. Stomach/Bowel: Distal esophagus, stomach and duodenal sweep are unremarkable. No small bowel wall thickening or dilatation. No evidence of obstruction. A normal appendix is visualized. No colonic dilatation or wall thickening. Vascular/Lymphatic: The aorta is normal caliber. Evaluation of lymph nodes is limited by a paucity of intraperitoneal  fat. No frankly abnormal nodes are seen in the abdomen or pelvis. Reproductive: The prostate and seminal vesicles are unremarkable. Other: No abdominopelvic free fluid or free gas. No bowel containing hernias. Musculoskeletal: Sclerotic superior endplate deformity at L5 has an appearance most can Padda bowl with a Schmorl's node formation. No other worrisome osseous lesions. IMPRESSION: 1. Mild bladder wall thickening with some gradient attenuation towards the posterior bladder. Could reflect an early cystitis versus underdistention. Correlation with urinalysis is recommended. 2. Sclerotic superior endplate deformity at L5 has an appearance most compatible with a Schmorl's node formation. Electronically Signed   By: Kreg Shropshire M.D.   On: 06/13/2019 22:12    Procedures Procedures (including critical care time)  Medications Ordered in ED Medications  capsaicin (ZOSTRIX) 0.025 % cream ( Topical Not Given 06/13/19 2054)  pantoprazole (PROTONIX) injection 40 mg (40 mg Intravenous Given 06/13/19 2047)  ondansetron (ZOFRAN) injection 4 mg (4 mg Intravenous Given 06/13/19 2047)  sodium chloride 0.9 % bolus 1,000 mL ( Intravenous Stopped 06/13/19 2159)  haloperidol lactate (HALDOL) injection 5 mg  (5 mg Intravenous Given 06/13/19 2047)    ED Course  I have reviewed the triage vital signs and the nursing notes.  Pertinent labs & imaging results that were available during my care of the patient were reviewed by me and considered in my medical decision making (see chart for details).    MDM Rules/Calculators/A&P                      Patient is a 29 year old male who presents with recurrent vomiting.  He had some upper abdominal pain and given that he has had several visits over the last month with no imaging, I did go ahead and do a CT scan which shows no acute abnormality other than some mild stranding around the bladder.  He has some bacteria in his urine but negative nitrate and negative leukocyte esterase.  It was sent for culture but he was not started on antibiotics at this point.  He does not have any symptoms of UTI other than decreased urination which is likely from his dehydration.  He does have an acute kidney injury but his potassium is not elevated.  He was hydrated with IV fluids and Haldol for his possible cannabis hyperemesis syndrome.  He denies any drug use although his urine drug screen was positive for marijuana and cocaine as well as benzos.  I spoke with Dr. Toniann Fail at [facility] and patient will be admitted for further treatment. Final Clinical Impression(s) / ED Diagnoses Final diagnoses:  Dehydration  Non-intractable vomiting with nausea, unspecified vomiting type  AKI (acute kidney injury) [facility])    Rx / DC Orders ED Discharge Orders    None       Rolan Bucco, MD 06/13/19 2323

## 2019-06-13 NOTE — ED Notes (Signed)
PT asked for urine. Mumbled "can I have my phone its out front" Did not say anything else. PT given urinal and again reminded of the need for sample. PT did not acknowledge. Will continue to monitor.

## 2019-06-13 NOTE — ED Triage Notes (Signed)
Vomiting since yesterday. Also states "my kidneys hurt". He states this has been a recurring problem x 1 month. Actively vomiting in triage.

## 2019-06-14 LAB — SARS CORONAVIRUS 2 (TAT 6-24 HRS): SARS Coronavirus 2: NEGATIVE

## 2019-06-14 LAB — URINE CULTURE: Culture: NO GROWTH

## 2019-06-14 NOTE — ED Notes (Signed)
[facility] here to transport patient to [facility] to inpatient bed assignment; patient refusing transport at this time. Patient called his mother and states she will pick him up. This nurse called patient's mother to confirm this; patient's mother reports that the patient told her his was being discharged. Advised her that transport was here to pick him up now. States she will call him back.

## 2019-06-14 NOTE — ED Notes (Signed)
Patient states he feels better and wants to go home; states he cant sleep here. Patient got dressed and left the ED. No acute distress noted. Patient ambulatory without difficulty with steady gait. Security aware and [facility] notified of patient's AMA status.

## 2019-06-14 NOTE — ED Notes (Signed)
Spoke with patient's mother, Rosey Bath, [phone], update given on status and plan of care; verbalized understanding.

## 2019-06-14 NOTE — ED Notes (Signed)
Patient in room trying to remove IV; nurse and [facility] staff at bedside to remove IV.

## 2019-06-17 LAB — GC/CHLAMYDIA PROBE AMP (~~LOC~~) NOT AT ARMC
Chlamydia: NEGATIVE
Neisseria Gonorrhea: NEGATIVE

## 2019-07-29 ENCOUNTER — Emergency Department (HOSPITAL_COMMUNITY)
Admission: EM | Admit: 2019-07-29 | Discharge: 2019-07-29 | Discharge status: Against Medical Advice | Payer: Self-pay | Attending: Emergency Medicine | Admitting: Emergency Medicine

## 2019-07-29 ENCOUNTER — Encounter (HOSPITAL_COMMUNITY): Payer: Self-pay | Admitting: Emergency Medicine

## 2019-07-29 ENCOUNTER — Other Ambulatory Visit: Payer: Self-pay

## 2019-07-29 DIAGNOSIS — R1084 Generalized abdominal pain: Secondary | ICD-10-CM | POA: Insufficient documentation

## 2019-07-29 DIAGNOSIS — N179 Acute kidney failure, unspecified: Secondary | ICD-10-CM | POA: Insufficient documentation

## 2019-07-29 DIAGNOSIS — R112 Nausea with vomiting, unspecified: Secondary | ICD-10-CM | POA: Insufficient documentation

## 2019-07-29 DIAGNOSIS — E860 Dehydration: Secondary | ICD-10-CM | POA: Insufficient documentation

## 2019-07-29 LAB — CBC WITH DIFFERENTIAL/PLATELET
Abs Immature Granulocytes: 0.06 10*3/uL (ref 0.00–0.07)
Basophils Absolute: 0 10*3/uL (ref 0.0–0.1)
Basophils Relative: 0 %
Eosinophils Absolute: 0 10*3/uL (ref 0.0–0.5)
Eosinophils Relative: 0 %
HCT: 58 % — ABNORMAL HIGH (ref 39.0–52.0)
Hemoglobin: 19.8 g/dL — ABNORMAL HIGH (ref 13.0–17.0)
Immature Granulocytes: 0 %
Lymphocytes Relative: 12 %
Lymphs Abs: 1.8 10*3/uL (ref 0.7–4.0)
MCH: 27.9 pg (ref 26.0–34.0)
MCHC: 34.1 g/dL (ref 30.0–36.0)
MCV: 81.8 fL (ref 80.0–100.0)
Monocytes Absolute: 0.9 10*3/uL (ref 0.1–1.0)
Monocytes Relative: 6 %
Neutro Abs: 12 10*3/uL — ABNORMAL HIGH (ref 1.7–7.7)
Neutrophils Relative %: 82 %
Platelets: 374 10*3/uL (ref 150–400)
RBC: 7.09 MIL/uL — ABNORMAL HIGH (ref 4.22–5.81)
RDW: 16.5 % — ABNORMAL HIGH (ref 11.5–15.5)
WBC: 14.7 10*3/uL — ABNORMAL HIGH (ref 4.0–10.5)
nRBC: 0 % (ref 0.0–0.2)

## 2019-07-29 LAB — BASIC METABOLIC PANEL
Anion gap: 12 (ref 5–15)
BUN: 33 mg/dL — ABNORMAL HIGH (ref 6–20)
CO2: 23 mmol/L (ref 22–32)
Calcium: 8.2 mg/dL — ABNORMAL LOW (ref 8.9–10.3)
Chloride: 109 mmol/L (ref 98–111)
Creatinine, Ser: 2.45 mg/dL — ABNORMAL HIGH (ref 0.61–1.24)
GFR calc Af Amer: 41 mL/min — ABNORMAL LOW (ref >60)
GFR calc non Af Amer: 35 mL/min — ABNORMAL LOW (ref >60)
Glucose, Bld: 104 mg/dL — ABNORMAL HIGH (ref 70–99)
Potassium: 4 mmol/L (ref 3.5–5.1)
Sodium: 144 mmol/L (ref 135–145)

## 2019-07-29 LAB — COMPREHENSIVE METABOLIC PANEL
ALT: 28 U/L (ref 0–44)
AST: 40 U/L (ref 15–41)
Albumin: 6 g/dL — ABNORMAL HIGH (ref 3.5–5.0)
Alkaline Phosphatase: 115 U/L (ref 38–126)
Anion gap: 23 — ABNORMAL HIGH (ref 5–15)
BUN: 37 mg/dL — ABNORMAL HIGH (ref 6–20)
CO2: 24 mmol/L (ref 22–32)
Calcium: 10.8 mg/dL — ABNORMAL HIGH (ref 8.9–10.3)
Chloride: 94 mmol/L — ABNORMAL LOW (ref 98–111)
Creatinine, Ser: 4.05 mg/dL — ABNORMAL HIGH (ref 0.61–1.24)
GFR calc Af Amer: 22 mL/min — ABNORMAL LOW (ref >60)
GFR calc non Af Amer: 19 mL/min — ABNORMAL LOW (ref >60)
Glucose, Bld: 133 mg/dL — ABNORMAL HIGH (ref 70–99)
Potassium: 4.6 mmol/L (ref 3.5–5.1)
Sodium: 141 mmol/L (ref 135–145)
Total Bilirubin: 0.8 mg/dL (ref 0.3–1.2)
Total Protein: 10.9 g/dL — ABNORMAL HIGH (ref 6.5–8.1)

## 2019-07-29 LAB — LIPASE, BLOOD: Lipase: 18 U/L (ref 11–51)

## 2019-07-29 LAB — LACTIC ACID, PLASMA
Lactic Acid, Venous: 4.5 mmol/L (ref 0.5–1.9)
Lactic Acid, Venous: 1.7 mmol/L (ref 0.5–1.9)

## 2019-07-29 LAB — CK: Total CK: 124 U/L (ref 49–397)

## 2019-07-29 MED ORDER — SODIUM CHLORIDE 0.9 % IV BOLUS
1000.0000 mL | Freq: Once | INTRAVENOUS | Status: AC
  Administered 2019-07-29: 1000 mL via INTRAVENOUS

## 2019-07-29 MED ORDER — HALOPERIDOL LACTATE 5 MG/ML IJ SOLN
5.0000 mg | Freq: Once | INTRAMUSCULAR | Status: AC
  Administered 2019-07-29: 12:00:00 5 mg via INTRAVENOUS
  Filled 2019-07-29: qty 1

## 2019-07-29 MED ORDER — CAPSAICIN 0.025 % EX CREA
TOPICAL_CREAM | Freq: Once | CUTANEOUS | Status: DC
  Filled 2019-07-29: qty 60

## 2019-07-29 MED ORDER — SODIUM CHLORIDE 0.9 % IV BOLUS
1000.0000 mL | Freq: Once | INTRAVENOUS | Status: AC
  Administered 2019-07-29: 14:00:00 1000 mL via INTRAVENOUS

## 2019-07-29 MED ORDER — ONDANSETRON HCL 4 MG/2ML IJ SOLN
4.0000 mg | Freq: Once | INTRAMUSCULAR | Status: AC
  Administered 2019-07-29: 4 mg via INTRAVENOUS
  Filled 2019-07-29: qty 2

## 2019-07-29 NOTE — ED Triage Notes (Signed)
Pt c/o vomiting x 5 days. C/o bilat hip pains

## 2019-07-29 NOTE — ED Provider Notes (Addendum)
[facility]-EMERGENCY DEPT Provider Note   CSN: [id number] Arrival date & time: 07/29/19  1018     History Chief Complaint  Patient presents with  . Emesis    John Beltran is a 29 y.o. male.  The history is provided by the patient and medical records. No language interpreter was used.  Emesis Severity:  Severe Duration:  5 days Timing:  Constant Quality:  Stomach contents Progression:  Unchanged Chronicity:  New Recent urination:  Normal Relieved by:  Nothing Worsened by:  Nothing Ineffective treatments:  None tried Associated symptoms: abdominal pain   Associated symptoms: no arthralgias, no chills, no cough, no diarrhea, no fever and no headaches        Past Medical History:  Diagnosis Date  . Polysubstance abuse [facility])     Patient Active Problem List   Diagnosis Date Noted  . Nausea & vomiting 06/13/2019  . ALLERGIC RHINITIS 09/29/2009    History reviewed. No pertinent surgical history.     No family history on file.  Social History   Tobacco Use  . Smoking status: Never Smoker  . Smokeless tobacco: Never Used  Substance Use Topics  . Alcohol use: No    Alcohol/week: 0.0 standard drinks  . Drug use: Yes    Types: Marijuana, Cocaine    Comment: benzodiazapine    Home Medications Prior to Admission medications   Medication Sig Start Date End Date Taking? Authorizing Provider  famotidine (PEPCID) 20 MG tablet Take 1 tablet (20 mg total) by mouth 2 (two) times daily. 05/20/19   Alvira Monday, MD  ondansetron (ZOFRAN) 4 MG tablet Take 1 tablet (4 mg total) by mouth every 6 (six) hours. 05/25/19   Tilden Fossa, MD    Allergies    Patient has no known allergies.  Review of Systems   Review of Systems  Constitutional: Negative for chills, diaphoresis, fatigue and fever.  HENT: Negative for congestion.   Eyes: Negative for visual disturbance.  Respiratory: Negative for cough, chest tightness, shortness of breath,  wheezing and stridor.   Cardiovascular: Negative for chest pain and palpitations.  Gastrointestinal: Positive for abdominal pain, constipation, nausea and vomiting. Negative for abdominal distention and diarrhea.  Genitourinary: Negative for decreased urine volume, dysuria, flank pain and frequency.  Musculoskeletal: Negative for arthralgias, back pain, neck pain and neck stiffness.  Skin: Negative for wound.  Neurological: Negative for light-headedness, numbness and headaches.  Psychiatric/Behavioral: Negative for agitation.  All other systems reviewed and are negative.   Physical Exam Updated Vital Signs BP (!) 128/107 (BP Location: Left Arm)   Pulse 80   Temp 98 F (36.7 C) (Oral)   Resp 20   SpO2 100%   Physical Exam Vitals and nursing note reviewed.  Constitutional:      Appearance: He is well-developed. He is not ill-appearing, toxic-appearing or diaphoretic.  HENT:     Head: Normocephalic and atraumatic.     Nose: No congestion or rhinorrhea.  Eyes:     Extraocular Movements: Extraocular movements intact.     Conjunctiva/sclera: Conjunctivae normal.     Pupils: Pupils are equal, round, and reactive to light.  Cardiovascular:     Rate and Rhythm: Normal rate and regular rhythm.     Heart sounds: No murmur.  Pulmonary:     Effort: Pulmonary effort is normal. No respiratory distress.     Breath sounds: Normal breath sounds. No wheezing, rhonchi or rales.  Chest:     Chest wall: No tenderness.  Abdominal:     General: Abdomen is flat.     Palpations: Abdomen is soft.     Tenderness: There is no abdominal tenderness. There is no right CVA tenderness, left CVA tenderness, guarding or rebound.  Musculoskeletal:        General: No tenderness.     Cervical back: Neck supple.  Skin:    General: Skin is warm and dry.     Capillary Refill: Capillary refill takes less than 2 seconds.     Findings: No erythema or rash.  Neurological:     General: No focal deficit present.      Mental Status: He is alert.     Sensory: No sensory deficit.     Motor: No weakness.  Psychiatric:        Mood and Affect: Mood normal.     ED Results / Procedures / Treatments   Labs (all labs ordered are listed, but only abnormal results are displayed) Labs Reviewed  CBC WITH DIFFERENTIAL/PLATELET - Abnormal; Notable for the following components:      Result Value   WBC 14.7 (*)    RBC 7.09 (*)    Hemoglobin 19.8 (*)    HCT 58.0 (*)    RDW 16.5 (*)    Neutro Abs 12.0 (*)    All other components within normal limits  COMPREHENSIVE METABOLIC PANEL - Abnormal; Notable for the following components:   Chloride 94 (*)    Glucose, Bld 133 (*)    BUN 37 (*)    Creatinine, Ser 4.05 (*)    Calcium 10.8 (*)    Total Protein 10.9 (*)    Albumin 6.0 (*)    GFR calc non Af Amer 19 (*)    GFR calc Af Amer 22 (*)    Anion gap 23 (*)    All other components within normal limits  LACTIC ACID, PLASMA - Abnormal; Notable for the following components:   Lactic Acid, Venous 4.5 (*)    All other components within normal limits  URINE CULTURE  CK  LIPASE, BLOOD  LACTIC ACID, PLASMA  URINALYSIS, ROUTINE W REFLEX MICROSCOPIC  BASIC METABOLIC PANEL    EKG None  Radiology No results found.  Procedures Procedures (including critical care time)  Medications Ordered in ED Medications  capsaicin (ZOSTRIX) 0.025 % cream (has no administration in time range)  sodium chloride 0.9 % bolus 1,000 mL (0 mLs Intravenous Stopped 07/29/19 1343)  sodium chloride 0.9 % bolus 1,000 mL (0 mLs Intravenous Stopped 07/29/19 1343)  haloperidol lactate (HALDOL) injection 5 mg (5 mg Intravenous Given 07/29/19 1221)  ondansetron (ZOFRAN) injection 4 mg (4 mg Intravenous Given 07/29/19 1221)  sodium chloride 0.9 % bolus 1,000 mL (0 mLs Intravenous Stopped 07/29/19 1543)    ED Course  I have reviewed the triage vital signs and the nursing notes.  Pertinent labs & imaging results that were available  during my care of the patient were reviewed by me and considered in my medical decision making (see chart for details).    MDM Rules/Calculators/A&P                      John Beltran is a 29 y.o. male with a past medical history significant for recurrent nausea and vomiting who presents with nausea, vomiting, abdominal aching, and bilateral thigh pains.  Patient reports that this is similar to what he had last month when he had to be admitted for nausea and vomiting and  dehydration.  He reports that for the last 5 days he has not been able to tolerate nearly any food or fluids and has very dry mouth.  He reports his anterior thighs are sore.  He denies any trauma.  He denies history of DVT, PE, or rhabdo in the past.  He denies any more distal leg pain or leg swelling.  He reports the nausea and vomiting has had no bloody component and he denies any diarrhea.  He does report some constipation but has still been passing gas.  He reports he had a CT scan during his last visit that was overall reassuring.  He denies any fevers, chills, chest pain, shortness of breath.  He denies any chest pain.  He is otherwise having dry heaving with nausea.  On exam, lungs clear and chest is nontender.  Abdomen is not focally tender on my exam and I did hear bowel sounds.  He had some tenderness in his anterior bilateral thighs but had good pulses sensation and strength in his lower legs.  No hip tenderness.  No flank or CVA tenderness.  No significant back tenderness.  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  He reports that previously a combination of nausea medicine, Haldol, and the capsaicin cream has helped significantly.  He thinks he is very dehydrated needs fluids.  Given his recent CT scan was reassuring, we agreed to hold on imaging and instead focus on rehydration, treating his symptoms, and reassessing the patient.  Dissipate  discharge if he is feeling better and work-up was reassuring.  2:52 PM Patient was reassessed and reports he is feeling much better.  Patient's labs again returned showing dehydration and acute kidney injury.  Patient's creatinine is now 4.05 compared to 2.06 last month.  Potassium is normal.  LFTs normal.  Anion gap is elevated likely related to the lactic acidosis which is 4.5.  Lipase not elevated.  CBC shows elevated hemoglobin and [place] blood cell count likely related to hemoconcentration.  No evidence of rhabdo with a normal CK.  Patient still waiting for urinalysis as well.  Spoke with patient and he wants to go home if his kidney function is improving after the fluids.  We will repeat lactic acid, repeat a BMP, and check a urinalysis before letting him go home.  If his labs have not significant improved, I asked anticipate patient will likely need admission for rehydration for his acute kidney injury.  If patient fails p.o. challenge after rehydration he will likely admission.  Care will be transferred to oncoming team while awaiting results of repeat labs and rehydration.  4:30 PM As patient was awaiting repeat labs including lactic, BMP, and urinalysis, patient told nursing he needed to leave.  He did not want to wait for these results.  We did not get to have a full AGAINST MEDICAL ADVICE discussion, he simply eloped.    I was able to tell him to please return if any symptoms change or worsen and that he needs to follow-up with his PCP to recheck this blood work.  I told him that if his kidney function worsen, he might end up on dialysis in the future if he continued to be more dehydrated but he wanted to leave.  He eloped but did not have here in acute distress.   Final Clinical Impression(s) / ED Diagnoses Final diagnoses:  Non-intractable vomiting with nausea, unspecified vomiting type  Generalized abdominal pain  Dehydration  AKI (acute kidney injury) ([place])     Clinical  Impression: 1. Non-intractable vomiting with nausea, unspecified vomiting type   2. Generalized abdominal pain   3. Dehydration   4. AKI (acute kidney injury) ([place])     Disposition: Patient eloped while awaiting repeat labs.  Patient encouraged to follow-up with PCP or return if his symptoms worsen.  This note was prepared with assistance of Systems analyst. Occasional wrong-word or sound-a-like substitutions may have occurred due to the inherent limitations of voice recognition software.     , Gwenyth Allegra, MD 07/29/19 1625    , Gwenyth Allegra, MD 07/29/19 [phone]

## 2019-07-30 ENCOUNTER — Encounter (HOSPITAL_BASED_OUTPATIENT_CLINIC_OR_DEPARTMENT_OTHER): Payer: Self-pay | Admitting: *Deleted

## 2019-07-30 ENCOUNTER — Other Ambulatory Visit: Payer: Self-pay

## 2019-07-30 ENCOUNTER — Emergency Department (HOSPITAL_BASED_OUTPATIENT_CLINIC_OR_DEPARTMENT_OTHER)
Admission: EM | Admit: 2019-07-30 | Discharge: 2019-07-30 | Disposition: A | Discharge status: Home / Self Care | Payer: Self-pay | Attending: Emergency Medicine | Admitting: Emergency Medicine

## 2019-07-30 DIAGNOSIS — F1290 Cannabis use, unspecified, uncomplicated: Secondary | ICD-10-CM

## 2019-07-30 DIAGNOSIS — F1910 Other psychoactive substance abuse, uncomplicated: Secondary | ICD-10-CM | POA: Insufficient documentation

## 2019-07-30 DIAGNOSIS — R1084 Generalized abdominal pain: Secondary | ICD-10-CM | POA: Insufficient documentation

## 2019-07-30 DIAGNOSIS — F12188 Cannabis abuse with other cannabis-induced disorder: Secondary | ICD-10-CM | POA: Insufficient documentation

## 2019-07-30 LAB — CBC WITH DIFFERENTIAL/PLATELET
Abs Immature Granulocytes: 0.01 10*3/uL (ref 0.00–0.07)
Basophils Absolute: 0 10*3/uL (ref 0.0–0.1)
Basophils Relative: 0 %
Eosinophils Absolute: 0.1 10*3/uL (ref 0.0–0.5)
Eosinophils Relative: 1 %
HCT: 49.1 % (ref 39.0–52.0)
Hemoglobin: 16.4 g/dL (ref 13.0–17.0)
Immature Granulocytes: 0 %
Lymphocytes Relative: 29 %
Lymphs Abs: 2.3 10*3/uL (ref 0.7–4.0)
MCH: 27.9 pg (ref 26.0–34.0)
MCHC: 33.4 g/dL (ref 30.0–36.0)
MCV: 83.5 fL (ref 80.0–100.0)
Monocytes Absolute: 0.6 10*3/uL (ref 0.1–1.0)
Monocytes Relative: 8 %
Neutro Abs: 5 10*3/uL (ref 1.7–7.7)
Neutrophils Relative %: 62 %
Platelets: 273 10*3/uL (ref 150–400)
RBC: 5.88 MIL/uL — ABNORMAL HIGH (ref 4.22–5.81)
RDW: 14.4 % (ref 11.5–15.5)
WBC: 8 10*3/uL (ref 4.0–10.5)
nRBC: 0 % (ref 0.0–0.2)

## 2019-07-30 LAB — URINALYSIS, ROUTINE W REFLEX MICROSCOPIC
Bilirubin Urine: NEGATIVE
Glucose, UA: NEGATIVE mg/dL
Hgb urine dipstick: NEGATIVE
Ketones, ur: 15 mg/dL — AB
Leukocytes,Ua: NEGATIVE
Nitrite: NEGATIVE
Protein, ur: NEGATIVE mg/dL
Specific Gravity, Urine: 1.025 (ref 1.005–1.030)
pH: 6 (ref 5.0–8.0)

## 2019-07-30 LAB — COMPREHENSIVE METABOLIC PANEL
ALT: 18 U/L (ref 0–44)
AST: 24 U/L (ref 15–41)
Albumin: 4.5 g/dL (ref 3.5–5.0)
Alkaline Phosphatase: 75 U/L (ref 38–126)
Anion gap: 13 (ref 5–15)
BUN: 25 mg/dL — ABNORMAL HIGH (ref 6–20)
CO2: 27 mmol/L (ref 22–32)
Calcium: 9.7 mg/dL (ref 8.9–10.3)
Chloride: 97 mmol/L — ABNORMAL LOW (ref 98–111)
Creatinine, Ser: 1.01 mg/dL (ref 0.61–1.24)
GFR calc Af Amer: >60 mL/min (ref >60)
GFR calc non Af Amer: >60 mL/min (ref >60)
Glucose, Bld: 79 mg/dL (ref 70–99)
Potassium: 3.8 mmol/L (ref 3.5–5.1)
Sodium: 137 mmol/L (ref 135–145)
Total Bilirubin: 1 mg/dL (ref 0.3–1.2)
Total Protein: 8.4 g/dL — ABNORMAL HIGH (ref 6.5–8.1)

## 2019-07-30 LAB — RAPID URINE DRUG SCREEN, HOSP PERFORMED
Amphetamines: NOT DETECTED
Barbiturates: NOT DETECTED
Benzodiazepines: NOT DETECTED
Cocaine: POSITIVE — AB
Opiates: NOT DETECTED
Tetrahydrocannabinol: POSITIVE — AB

## 2019-07-30 LAB — LIPASE, BLOOD: Lipase: 23 U/L (ref 11–51)

## 2019-07-30 MED ORDER — HALOPERIDOL LACTATE 5 MG/ML IJ SOLN
2.0000 mg | Freq: Once | INTRAMUSCULAR | Status: AC
  Administered 2019-07-30: 2 mg via INTRAVENOUS
  Filled 2019-07-30: qty 1

## 2019-07-30 MED ORDER — ONDANSETRON HCL 4 MG/2ML IJ SOLN: 4.0000 mg | Freq: Once | INTRAMUSCULAR | Status: DC | PRN

## 2019-07-30 MED ORDER — SODIUM CHLORIDE 0.9 % IV BOLUS
1000.0000 mL | Freq: Once | INTRAVENOUS | Status: AC
  Administered 2019-07-30: 22:00:00 1000 mL via INTRAVENOUS

## 2019-07-30 MED ORDER — PROMETHAZINE HCL 25 MG PO TABS: 25.0000 mg | ORAL_TABLET | Freq: Four times a day (QID) | ORAL | 0 refills | Status: DC | PRN

## 2019-07-30 MED ORDER — ONDANSETRON HCL 4 MG/2ML IJ SOLN
INTRAMUSCULAR | Status: AC
  Filled 2019-07-30: qty 2

## 2019-07-30 NOTE — ED Notes (Signed)
Pt unable to urinate at this time.  

## 2019-07-30 NOTE — ED Triage Notes (Signed)
TMJ. Jaw pain today. Vomiting x 4 days.

## 2019-07-30 NOTE — ED Provider Notes (Signed)
11:00 PM Assumed care.  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  Labs here are reassuring.  Yesterday had AKI that has resolved.  Just received Haldol.  Will need to be reassessed.  11:20 PM  Pt received Haldol approximately an hour ago.  Went to reassess patient and he states "how much longer my going to be here?".  Reports he is feeling better and ready for discharge home.  He is tolerating p.o.   At this time, I do not feel there is any life-threatening condition present. I have reviewed, interpreted and discussed all results (EKG, imaging, lab, urine as appropriate) and exam findings with patient/family. I have reviewed nursing notes and appropriate previous records.  I feel the patient is safe to be discharged home without further emergent workup and can continue workup as an outpatient as needed. Discussed usual and customary return precautions. Patient/family verbalize understanding and are comfortable with this plan.  Outpatient follow-up has been provided as needed. All questions have been answered.    Denese Mentink, Layla Maw, DO 07/30/19 2323

## 2019-07-30 NOTE — ED Provider Notes (Signed)
[facility] EMERGENCY DEPARTMENT Provider Note   CSN: [id number] Arrival date & time: 07/30/19  1901     History Chief Complaint  Patient presents with  . Temporomandibular Joint Pain    John Beltran is a 29 y.o. male history of polysubstance abuse.  Patient presents today for nausea vomiting and abdominal pain.  Patient reports that his symptoms began approximately 4-5 days ago he reports multiple episodes of nonbloody/nonbilious emesis anytime he attempts to eat or drink.  He reports that after several days of vomiting he developed a moderate intensity crampy abdominal pain that is diffuse nonradiating worsened with vomiting improved with rest.  He reports that he feels his vomiting today is from his marijuana use, he reports that he attempted to stop using marijuana at this time.  Patient told triage staff that he was coming in for left TMJ pain, he reports that after several episodes of vomiting today he did have a moderate intensity throbbing sensation to his "left jaw" this has completely resolved prior to my initial evaluation without any intervention for the patient.  He denies any fever, headache, neck pain, trismus, ear pain, sore throat, chest pain/shortness of breath, cough, hemoptysis, hematemesis, diarrhea, blood in the stool, dysuria/hematuria, testicular pain/swelling or any additional concerns today.  HPI     Past Medical History:  Diagnosis Date  . Polysubstance abuse [facility])     Patient Active Problem List   Diagnosis Date Noted  . Nausea & vomiting 06/13/2019  . ALLERGIC RHINITIS 09/29/2009    History reviewed. No pertinent surgical history.     No family history on file.  Social History   Tobacco Use  . Smoking status: Never Smoker  . Smokeless tobacco: Never Used  Substance Use Topics  . Alcohol use: No    Alcohol/week: 0.0 standard drinks  . Drug use: Yes    Types: Marijuana, Cocaine    Comment: benzodiazapine    Home  Medications Prior to Admission medications   Medication Sig Start Date End Date Taking? Authorizing Provider  dimenhyDRINATE (DRAMAMINE) 50 MG tablet Take 50 mg by mouth every 8 (eight) hours as needed for nausea.    [provider]  famotidine (PEPCID) 20 MG tablet Take 1 tablet (20 mg total) by mouth 2 (two) times daily. Patient not taking: Reported on 07/29/2019 05/20/19   Alvira Monday, MD  ondansetron (ZOFRAN) 4 MG tablet Take 1 tablet (4 mg total) by mouth every 6 (six) hours. Patient not taking: Reported on 07/29/2019 05/25/19   Tilden Fossa, MD    Allergies    Patient has no known allergies.  Review of Systems   Review of Systems Ten systems are reviewed and are negative for acute change except as noted in the HPI  Physical Exam Updated Vital Signs BP 119/83   Pulse (!) 55   Temp 98.8 F (37.1 C) (Oral)   Resp 15   Ht 5\' 6"  (1.676 m)   Wt 55.8 kg   SpO2 98%   BMI 19.85 kg/m   Physical Exam Constitutional:      General: He is not in acute distress.    Appearance: Normal appearance. He is well-developed. He is not ill-appearing or diaphoretic.  HENT:     Head: Normocephalic and atraumatic.     Jaw: There is normal jaw occlusion. No trismus.     Right Ear: Tympanic membrane and external ear normal. No hemotympanum.     Left Ear: Tympanic membrane and external ear normal. No  hemotympanum.     Nose: Nose normal.     Mouth/Throat:     Mouth: Mucous membranes are moist.     Pharynx: Oropharynx is clear. Uvula midline.     Comments: The patient has normal phonation and is in control of secretions. No stridor.  Midline uvula without edema. Soft palate rises symmetrically. No tonsillar erythema, swelling or exudates. Tongue protrusion is normal, floor of mouth is soft. No trismus. No creptius on neck palpation. No gingival erythema or fluctuance noted. Mucus membranes moist. Eyes:     General: Vision grossly intact. Gaze aligned appropriately.     Extraocular  Movements: Extraocular movements intact.     Pupils: Pupils are equal, round, and reactive to light.  Neck:     Trachea: Trachea and phonation normal. No tracheal deviation.  Pulmonary:     Effort: Pulmonary effort is normal. No respiratory distress.     Breath sounds: Normal air entry.  Abdominal:     General: There is no distension.     Palpations: Abdomen is soft.     Tenderness: There is generalized abdominal tenderness. There is no guarding or rebound.  Musculoskeletal:        General: Normal range of motion.     Cervical back: Full passive range of motion without pain, normal range of motion and neck supple.  Skin:    General: Skin is warm and dry.  Neurological:     Mental Status: He is alert.     GCS: GCS eye subscore is 4. GCS verbal subscore is 5. GCS motor subscore is 6.     Comments: Speech is clear and goal oriented, follows commands Major Cranial nerves without deficit, no facial droop Moves extremities without ataxia, coordination intact  Psychiatric:        Behavior: Behavior normal.    ED Results / Procedures / Treatments   Labs (all labs ordered are listed, but only abnormal results are displayed) Labs Reviewed  CBC WITH DIFFERENTIAL/PLATELET - Abnormal; Notable for the following components:      Result Value   RBC 5.88 (*)    All other components within normal limits  COMPREHENSIVE METABOLIC PANEL - Abnormal; Notable for the following components:   Chloride 97 (*)    BUN 25 (*)    Total Protein 8.4 (*)    All other components within normal limits  LIPASE, BLOOD  URINALYSIS, ROUTINE W REFLEX MICROSCOPIC  RAPID URINE DRUG SCREEN, HOSP PERFORMED    EKG EKG Interpretation  Date/Time:  Tuesday July 30 2019 21:33:41 EST Ventricular Rate:  84 PR Interval:    QRS Duration: 85 QT Interval:  375 QTC Calculation: 444 R Axis:   91 Text Interpretation: Sinus rhythm Nonspecific ST elevation Early repolarization, probably normal variant No significant  change since last tracing Confirmed by Vanetta Mulders [phone]) on 07/30/2019 9:49:50 PM   Radiology No results found.  Procedures Procedures (including critical care time)  Medications Ordered in ED Medications  ondansetron (ZOFRAN) injection 4 mg (4 mg Intravenous Not Given 07/30/19 2228)  sodium chloride 0.9 % bolus 1,000 mL (0 mLs Intravenous Stopped 07/30/19 2228)  haloperidol lactate (HALDOL) injection 2 mg (2 mg Intravenous Given 07/30/19 2233)    ED Course  I have reviewed the triage vital signs and the nursing notes.  Pertinent labs & imaging results that were available during my care of the patient were reviewed by me and considered in my medical decision making (see chart for details).    MDM  Rules/Calculators/A&P                     28 year old male presents today with several day history of nausea with nonbloody/nonbilious emesis and crampy abdominal pain.  He reports he believes this is from his marijuana use.  Additionally he reported left TMJ pain to triage staff but this is completely resolved prior to my initial evaluation.  On my exam he is tired appearing but no acute distress not actively vomiting.  His tympanic membranes are clear bilaterally, he has no trismus, no intraoral swelling concerning for PTA, RPA, Ludwig's, tonsillitis or other deep space infection of the head or neck.  Will obtain abdominal labs give fluid bolus, will give Haldol for suspected cannabis hyperemesis syndrome if he develops nausea. - Chart reviewed it appears patient has multiple recent visits for non-intractable nausea vomiting, dehydration.  He did not mention this on my initial evaluation but appears that he was seen at [facility] long emergency department yesterday, it appears he was there between the hours of 10 AM and 5 PM.  He received 2 L normal saline, 5 mg Haldol, 4 mg Zofran.  Lab work yesterday was significant for an AKI with creatinine of 4.05 and lactic of 4.5.  It appears his lactate  cleared after the 2 L of saline and creatinine had improved to 2.45.  It does appear that patient eloped prior to discharge yesterday.  Of note CK was within normal limits. - Today's labs reviewed, CBC without leukocytosis to suggest infection, no signs of anemia.  CMP reassuring it appears that his AKI has resolved, creatinine is 1.01 at this time.  Urinalysis shows 15 ketones, suspect secondary to dehydration.  Lipase within normal limits no evidence of pancreatitis.  UDS is pending.  EKG reviewed with Dr. Rogene Houston, no significant change from prior, QTC 444.  Suspect patient's symptoms may be secondary to a cannabis hyperemesis syndrome/cyclical vomiting today.  Do not feel there is indication for CT abdomen pelvis at this time, will monitor.  10:30 PM: I was informed by RN that patient experiencing nausea and vomiting, 2 mg Haldol ordered. - Care handoff given to Dr. Leonides Schanz at shift change.  Plan of care is reassess. Disposition per oncoming team.  Note: Portions of this report may have been transcribed using voice recognition software. Every effort was made to ensure accuracy; however, inadvertent computerized transcription errors may still be present. Final Clinical Impression(s) / ED Diagnoses Final diagnoses:  None    Rx / DC Orders ED Discharge Orders    None       Gari Crown 07/30/19 2310    Fredia Sorrow, MD 07/31/19 [phone]

## 2019-07-30 NOTE — Discharge Instructions (Signed)
I recommend that you stop smoking marijuana.  Your labs, urine today were normal.  Please increase your fluid intake at home.

## 2019-08-03 ENCOUNTER — Emergency Department (HOSPITAL_BASED_OUTPATIENT_CLINIC_OR_DEPARTMENT_OTHER)
Admission: EM | Admit: 2019-08-03 | Discharge: 2019-08-03 | Disposition: A | Discharge status: Home / Self Care | Payer: Self-pay | Attending: Emergency Medicine | Admitting: Emergency Medicine

## 2019-08-03 ENCOUNTER — Encounter (HOSPITAL_BASED_OUTPATIENT_CLINIC_OR_DEPARTMENT_OTHER): Payer: Self-pay | Admitting: Oncology

## 2019-08-03 ENCOUNTER — Other Ambulatory Visit: Payer: Self-pay

## 2019-08-03 DIAGNOSIS — F12188 Cannabis abuse with other cannabis-induced disorder: Secondary | ICD-10-CM

## 2019-08-03 DIAGNOSIS — Z79899 Other long term (current) drug therapy: Secondary | ICD-10-CM | POA: Insufficient documentation

## 2019-08-03 DIAGNOSIS — F1219 Cannabis abuse with unspecified cannabis-induced disorder: Secondary | ICD-10-CM | POA: Insufficient documentation

## 2019-08-03 HISTORY — DX: Cannabis abuse with other cannabis-induced disorder: F12.188

## 2019-08-03 MED ORDER — OMEPRAZOLE 40 MG PO CPDR: DELAYED_RELEASE_CAPSULE | ORAL | 0 refills | Status: DC

## 2019-08-03 MED ORDER — SUCRALFATE 1 G PO TABS: 1.0000 g | ORAL_TABLET | Freq: Three times a day (TID) | ORAL | 0 refills | Status: DC

## 2019-08-03 MED ORDER — SUCRALFATE 1 GM/10ML PO SUSP
1.0000 g | Freq: Once | ORAL | Status: AC
  Administered 2019-08-03: 1 g via ORAL
  Filled 2019-08-03: qty 10

## 2019-08-03 MED ORDER — HALOPERIDOL LACTATE 5 MG/ML IJ SOLN
5.0000 mg | Freq: Once | INTRAMUSCULAR | Status: AC
  Administered 2019-08-03: 03:00:00 5 mg via INTRAVENOUS
  Filled 2019-08-03: qty 1

## 2019-08-03 MED ORDER — SODIUM CHLORIDE 0.9 % IV BOLUS
1000.0000 mL | Freq: Once | INTRAVENOUS | Status: AC
  Administered 2019-08-03: 03:00:00 1000 mL via INTRAVENOUS

## 2019-08-03 MED ORDER — PANTOPRAZOLE SODIUM 40 MG IV SOLR
40.0000 mg | Freq: Once | INTRAVENOUS | Status: AC
  Administered 2019-08-03: 03:00:00 40 mg via INTRAVENOUS
  Filled 2019-08-03: qty 40

## 2019-08-03 NOTE — ED Provider Notes (Signed)
[facility] Provider Note: John Dell, MD, FACEP  CSN: [id number] MRN: [id number] ARRIVAL: 08/03/19 at 0249 ROOM: [facility]12/[facility]12   CHIEF COMPLAINT  Vomiting   HISTORY OF PRESENT ILLNESS  08/03/19 2:59 AM John Beltran is a 29 y.o. male with a history of polysubstance abuse and cannabis hyperemesis syndrome.  He has had multiple recent visits for the latter and states he has had problems with nausea and vomiting since December.  On his most recent visit to the ED he got relief with Haldol and was discharged home with promethazine.  He states the promethazine will help him sleep but does not prevent nausea and vomiting after he eats.  He continues to have difficulty keeping food down and as a consequence he has had decreased bowel movements.  He denies any current abdominal pain.  He states he has been abstinent from marijuana for about a week.   Past Medical History:  Diagnosis Date  . 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 Polysubstance abuse ([facility])     History reviewed. No pertinent surgical history.  No family history on file.  Social History   Tobacco Use  . Smoking status: Never Smoker  . Smokeless tobacco: Never Used  Substance Use Topics  . Alcohol use: No    Alcohol/week: 0.0 standard drinks  . Drug use: Yes    Types: Marijuana, Cocaine    Comment: benzodiazapine    Prior to Admission medications   Medication Sig Start Date End Date Taking? Authorizing Provider  omeprazole (PRILOSEC) 40 MG capsule Take 1 capsule at least 30 minutes prior to first daily dose of Carafate. 08/03/19   Kweku Stankey, Jonny Ruiz, MD  promethazine (PHENERGAN) 25 MG tablet Take 1 tablet (25 mg total) by mouth every 6 (six) hours as needed for nausea or vomiting. 07/30/19   Ward, Layla Maw, DO  sucralfate (CARAFATE) 1 g tablet Take 1 tablet (1 g total) by mouth 4 (four) times daily -  with meals and at bedtime. 08/03/19   Aeden Matranga, MD  famotidine  (PEPCID) 20 MG tablet Take 1 tablet (20 mg total) by mouth 2 (two) times daily. Patient not taking: Reported on 07/29/2019 05/20/19 08/03/19  Alvira Monday, MD    Allergies Patient has no known allergies.   REVIEW OF SYSTEMS  Negative except as noted here or in the History of Present Illness.   PHYSICAL EXAMINATION  Initial Vital Signs Blood pressure (!) 134/95, pulse (!) 104, temperature (!) 97.4 F (36.3 C), temperature source Oral, resp. rate 20, height 5\' 6"  (1.676 m), weight 55.8 kg, SpO2 100 %.  Examination General: Well-developed, well-nourished male in no acute distress; appearance consistent with age of record HENT: normocephalic; atraumatic Eyes: pupils equal, round and reactive to light; extraocular muscles intact Neck: supple Heart: regular rate and rhythm Lungs: clear to auscultation bilaterally Abdomen: soft; nondistended; nontender; no masses or hepatosplenomegaly; bowel sounds present Extremities: No deformity; full range of motion; pulses normal Neurologic: Awake, alert and oriented; motor function intact in all extremities and symmetric; no facial droop Skin: Warm and dry Psychiatric: Normal mood and affect   RESULTS  Summary of this visit's results, reviewed and interpreted by myself:   EKG Interpretation  Date/Time:    Ventricular Rate:    PR Interval:    QRS Duration:   QT Interval:    QTC Calculation:   R Axis:     Text Interpretation:  Laboratory Studies: No results found for this or any previous visit (from the past 24 hour(s)). Imaging Studies: No results found.  ED COURSE and MDM  Nursing notes, initial and subsequent vitals signs, including pulse oximetry, reviewed and interpreted by myself.  Vitals:   08/03/19 0430 08/03/19 0500 08/03/19 0530 08/03/19 0615  BP: 108/74 101/75 108/75   Pulse: 90 81 88   Resp: (!) 22 18 (!) 21   Temp:    98 F (36.7 C)  TempSrc:    Oral  SpO2: 98% 98% 98%   Weight:      Height:        Medications  sodium chloride 0.9 % bolus 1,000 mL (0 mLs Intravenous Stopped 08/03/19 0409)  haloperidol lactate (HALDOL) injection 5 mg (5 mg Intravenous Given 08/03/19 0311)  pantoprazole (PROTONIX) injection 40 mg (40 mg Intravenous Given 08/03/19 0311)  sucralfate (CARAFATE) 1 GM/10ML suspension 1 g (1 g Oral Given 08/03/19 0619)   6:16 AM Patient feeling better after IV fluids, Haldol and Protonix.  Patient is concerned he may be developing an ulcer and wishes to be treated for this.  We will start him on a PPI and Protonix.  He was advised he may need to be abstinent from marijuana 4 weeks before the hyperemesis symptoms begin to resolve.  He should continue to be abstinent.   PROCEDURES  Procedures   ED DIAGNOSES     ICD-10-CM   1. 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

## 2019-08-03 NOTE — ED Triage Notes (Signed)
Pt c/o N/V and abdominal pain.  Denies marijuana use.

## 2019-09-22 ENCOUNTER — Emergency Department (HOSPITAL_BASED_OUTPATIENT_CLINIC_OR_DEPARTMENT_OTHER)
Admission: EM | Admit: 2019-09-22 | Discharge: 2019-09-22 | Disposition: A | Discharge status: Home / Self Care | Payer: Self-pay | Attending: Emergency Medicine | Admitting: Emergency Medicine

## 2019-09-22 DIAGNOSIS — R112 Nausea with vomiting, unspecified: Secondary | ICD-10-CM | POA: Insufficient documentation

## 2019-09-22 DIAGNOSIS — R1030 Lower abdominal pain, unspecified: Secondary | ICD-10-CM | POA: Insufficient documentation

## 2019-09-22 DIAGNOSIS — Z5321 Procedure and treatment not carried out due to patient leaving prior to being seen by health care provider: Secondary | ICD-10-CM | POA: Insufficient documentation

## 2019-09-22 NOTE — ED Triage Notes (Signed)
Pt states he was at work and drank red bull and ate chips. Soon after states he had lower abd pain, nausea/vomiting. Pt is adamant that he needs fluid, appears anxious and is hyperventilating. Pt states he is "tingly all over". Pt instructed to slow his breathing. Pt writhing in stretcher stating "please, my stomach. I need fluids, that's the only thing that helps me". Pt told multiple times during triage attempt that information is needed first and then he can be properly helped. RN encouraged pt to be patient and try to answer questions so that the cause of his problem may be determined. Pt asked when triage was complete would he have to return to the waiting area. Pt informed there were no available beds at this time. Pt states " this isn't helping me. I am going to find another hospital". RN encouraged pt to stay and been seen, but pt adamant he wanted to go find another facility and asked how to exit the triage room.

## 2019-09-23 ENCOUNTER — Emergency Department (HOSPITAL_BASED_OUTPATIENT_CLINIC_OR_DEPARTMENT_OTHER)
Admission: EM | Admit: 2019-09-23 | Discharge: 2019-09-23 | Disposition: A | Discharge status: Home / Self Care | Payer: Self-pay | Attending: Emergency Medicine | Admitting: Emergency Medicine

## 2019-09-23 ENCOUNTER — Other Ambulatory Visit: Payer: Self-pay

## 2019-09-23 ENCOUNTER — Encounter (HOSPITAL_BASED_OUTPATIENT_CLINIC_OR_DEPARTMENT_OTHER): Payer: Self-pay | Admitting: *Deleted

## 2019-09-23 DIAGNOSIS — E860 Dehydration: Secondary | ICD-10-CM | POA: Insufficient documentation

## 2019-09-23 DIAGNOSIS — F1210 Cannabis abuse, uncomplicated: Secondary | ICD-10-CM | POA: Insufficient documentation

## 2019-09-23 DIAGNOSIS — Z20822 Contact with and (suspected) exposure to covid-19: Secondary | ICD-10-CM | POA: Insufficient documentation

## 2019-09-23 DIAGNOSIS — R112 Nausea with vomiting, unspecified: Secondary | ICD-10-CM | POA: Insufficient documentation

## 2019-09-23 DIAGNOSIS — N179 Acute kidney failure, unspecified: Secondary | ICD-10-CM | POA: Insufficient documentation

## 2019-09-23 DIAGNOSIS — K5900 Constipation, unspecified: Secondary | ICD-10-CM | POA: Insufficient documentation

## 2019-09-23 DIAGNOSIS — F1410 Cocaine abuse, uncomplicated: Secondary | ICD-10-CM | POA: Insufficient documentation

## 2019-09-23 DIAGNOSIS — I493 Ventricular premature depolarization: Secondary | ICD-10-CM | POA: Insufficient documentation

## 2019-09-23 LAB — COMPREHENSIVE METABOLIC PANEL
ALT: 36 U/L (ref 0–44)
AST: 38 U/L (ref 15–41)
Albumin: 6 g/dL — ABNORMAL HIGH (ref 3.5–5.0)
Alkaline Phosphatase: 102 U/L (ref 38–126)
Anion gap: >20 — ABNORMAL HIGH (ref 5–15)
BUN: 33 mg/dL — ABNORMAL HIGH (ref 6–20)
CO2: 20 mmol/L — ABNORMAL LOW (ref 22–32)
Calcium: 10.9 mg/dL — ABNORMAL HIGH (ref 8.9–10.3)
Chloride: 100 mmol/L (ref 98–111)
Creatinine, Ser: 1.91 mg/dL — ABNORMAL HIGH (ref 0.61–1.24)
GFR calc Af Amer: 54 mL/min — ABNORMAL LOW (ref >60)
GFR calc non Af Amer: 47 mL/min — ABNORMAL LOW (ref >60)
Glucose, Bld: 164 mg/dL — ABNORMAL HIGH (ref 70–99)
Potassium: 4 mmol/L (ref 3.5–5.1)
Sodium: 145 mmol/L (ref 135–145)
Total Bilirubin: 0.6 mg/dL (ref 0.3–1.2)
Total Protein: 10.7 g/dL — ABNORMAL HIGH (ref 6.5–8.1)

## 2019-09-23 LAB — CBC WITH DIFFERENTIAL/PLATELET
Abs Immature Granulocytes: 0.05 10*3/uL (ref 0.00–0.07)
Basophils Absolute: 0 10*3/uL (ref 0.0–0.1)
Basophils Relative: 0 %
Eosinophils Absolute: 0 10*3/uL (ref 0.0–0.5)
Eosinophils Relative: 0 %
HCT: 56 % — ABNORMAL HIGH (ref 39.0–52.0)
Hemoglobin: 18.5 g/dL — ABNORMAL HIGH (ref 13.0–17.0)
Immature Granulocytes: 0 %
Lymphocytes Relative: 8 %
Lymphs Abs: 0.9 10*3/uL (ref 0.7–4.0)
MCH: 27.4 pg (ref 26.0–34.0)
MCHC: 33 g/dL (ref 30.0–36.0)
MCV: 83.1 fL (ref 80.0–100.0)
Monocytes Absolute: 0.4 10*3/uL (ref 0.1–1.0)
Monocytes Relative: 3 %
Neutro Abs: 10.2 10*3/uL — ABNORMAL HIGH (ref 1.7–7.7)
Neutrophils Relative %: 89 %
Platelets: 342 10*3/uL (ref 150–400)
RBC: 6.74 MIL/uL — ABNORMAL HIGH (ref 4.22–5.81)
RDW: 15.2 % (ref 11.5–15.5)
WBC: 11.5 10*3/uL — ABNORMAL HIGH (ref 4.0–10.5)
nRBC: 0 % (ref 0.0–0.2)

## 2019-09-23 LAB — BASIC METABOLIC PANEL
Anion gap: 15 (ref 5–15)
BUN: 25 mg/dL — ABNORMAL HIGH (ref 6–20)
CO2: 23 mmol/L (ref 22–32)
Calcium: 8.6 mg/dL — ABNORMAL LOW (ref 8.9–10.3)
Chloride: 109 mmol/L (ref 98–111)
Creatinine, Ser: 1.43 mg/dL — ABNORMAL HIGH (ref 0.61–1.24)
GFR calc Af Amer: >60 mL/min (ref >60)
GFR calc non Af Amer: >60 mL/min (ref >60)
Glucose, Bld: 98 mg/dL (ref 70–99)
Potassium: 3.9 mmol/L (ref 3.5–5.1)
Sodium: 147 mmol/L — ABNORMAL HIGH (ref 135–145)

## 2019-09-23 LAB — LIPASE, BLOOD: Lipase: 16 U/L (ref 11–51)

## 2019-09-23 LAB — MAGNESIUM: Magnesium: 2.4 mg/dL (ref 1.7–2.4)

## 2019-09-23 LAB — RAPID URINE DRUG SCREEN, HOSP PERFORMED
Amphetamines: NOT DETECTED
Barbiturates: NOT DETECTED
Benzodiazepines: NOT DETECTED
Cocaine: POSITIVE — AB
Opiates: NOT DETECTED
Tetrahydrocannabinol: POSITIVE — AB

## 2019-09-23 MED ORDER — SODIUM CHLORIDE 0.9 % IV BOLUS
1000.0000 mL | Freq: Once | INTRAVENOUS | Status: AC
  Administered 2019-09-23: 1000 mL via INTRAVENOUS

## 2019-09-23 MED ORDER — PROMETHAZINE HCL 25 MG RE SUPP: 25.0000 mg | Freq: Four times a day (QID) | RECTAL | 0 refills | Status: DC | PRN

## 2019-09-23 MED ORDER — ONDANSETRON 4 MG PO TBDP: ORAL_TABLET | ORAL | 0 refills | Status: DC

## 2019-09-23 MED ORDER — FAMOTIDINE IN NACL 20-0.9 MG/50ML-% IV SOLN
20.0000 mg | Freq: Once | INTRAVENOUS | Status: AC
  Administered 2019-09-23: 10:00:00 20 mg via INTRAVENOUS
  Filled 2019-09-23: qty 50

## 2019-09-23 MED ORDER — DROPERIDOL 2.5 MG/ML IJ SOLN
1.2500 mg | Freq: Once | INTRAMUSCULAR | Status: AC
  Administered 2019-09-23: 10:00:00 1.25 mg via INTRAVENOUS
  Filled 2019-09-23: qty 2

## 2019-09-23 MED ORDER — SODIUM CHLORIDE 0.9 % IV BOLUS
1000.0000 mL | Freq: Once | INTRAVENOUS | Status: AC
  Administered 2019-09-23 (×3): 1000 mL via INTRAVENOUS

## 2019-09-23 NOTE — Discharge Instructions (Signed)
You were seen in the emergency department for nausea vomiting and dehydration.  Your lab work showed you to be very dehydrated.  You were given IV fluids and nausea medication with improvement in your symptoms.  Will be very important that you follow-up with your doctor and please return to the emergency department if any worsening or concerning symptoms.  Will also be important for you to abstain from marijuana as this may have caused her symptoms.

## 2019-09-23 NOTE — ED Triage Notes (Signed)
Having abdominal pain, onset today. States has been having difficulty with BM, states last BM was 2 days ago. States has not eaten in the past 2 days. States drank water and ginger ale approx 1 hour ago.

## 2019-09-23 NOTE — ED Notes (Signed)
Pt is sleeping at this time.

## 2019-09-23 NOTE — ED Provider Notes (Signed)
[facility] EMERGENCY DEPARTMENT Provider Note   CSN: [id number] Arrival date & time: 09/23/19  6144     History Chief Complaint  Patient presents with  . Emesis    John Beltran is a 29 y.o. male.  He is complaining of abdominal pain and vomiting that started yesterday after drinking red bull.  He has had this before.  Abdominal pain is generalized.  He is also felt constipated.  He came here yesterday but left without being seen.  No known fevers.  No trauma.  No prior abdominal surgeries.  Denies any blood in the vomitus.  Patient denies any recent marijuana use.  The history is provided by the patient.  Emesis Severity:  Severe Duration:  2 days Timing:  Intermittent Number of daily episodes:  20 Quality:  Stomach contents Progression:  Unchanged Chronicity:  Recurrent Recent urination:  Normal Relieved by:  Nothing Worsened by:  Nothing Ineffective treatments:  None tried Associated symptoms: abdominal pain   Associated symptoms: no chills, no cough, no diarrhea, no fever, no headaches and no sore throat   Risk factors: no prior abdominal surgery, no sick contacts, no suspect food intake and no travel to endemic areas        Past Medical History:  Diagnosis Date  . 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 Polysubstance abuse [facility])     Patient Active Problem List   Diagnosis Date Noted  . Nausea & vomiting 06/13/2019  . ALLERGIC RHINITIS 09/29/2009    History reviewed. No pertinent surgical history.     History reviewed. No pertinent family history.  Social History   Tobacco Use  . Smoking status: Never Smoker  . Smokeless tobacco: Never Used  Substance Use Topics  . Alcohol use: No    Alcohol/week: 0.0 standard drinks  . Drug use: Yes    Types: Marijuana, Cocaine    Comment: benzodiazapine    Home Medications Prior to Admission medications   Medication Sig Start Date End Date Taking?  Authorizing Provider  omeprazole (PRILOSEC) 40 MG capsule Take 1 capsule at least 30 minutes prior to first daily dose of Carafate. 08/03/19   Molpus, Jonny Ruiz, MD  promethazine (PHENERGAN) 25 MG tablet Take 1 tablet (25 mg total) by mouth every 6 (six) hours as needed for nausea or vomiting. 07/30/19   Ward, Layla Maw, DO  sucralfate (CARAFATE) 1 g tablet Take 1 tablet (1 g total) by mouth 4 (four) times daily -  with meals and at bedtime. 08/03/19   Molpus, John, MD  famotidine (PEPCID) 20 MG tablet Take 1 tablet (20 mg total) by mouth 2 (two) times daily. Patient not taking: Reported on 07/29/2019 05/20/19 08/03/19  Alvira Monday, MD    Allergies    Patient has no known allergies.  Review of Systems   Review of Systems  Constitutional: Negative for chills and fever.  HENT: Negative for sore throat.   Eyes: Negative for visual disturbance.  Respiratory: Negative for cough and shortness of breath.   Cardiovascular: Negative for chest pain.  Gastrointestinal: Positive for abdominal pain, nausea and vomiting. Negative for diarrhea.  Genitourinary: Negative for dysuria.  Musculoskeletal: Negative for neck pain.  Skin: Negative for rash.  Neurological: Negative for headaches.    Physical Exam Updated Vital Signs BP (!) 145/105 (BP Location: Right Arm)   Pulse 93   Temp 99 F (37.2 C) (Oral)   Resp 20   Ht 5\' 6"  (1.676  m)   Wt 59 kg   SpO2 100%   BMI 20.98 kg/m   Physical Exam Vitals and nursing note reviewed.  Constitutional:      Appearance: He is well-developed.  HENT:     Head: Normocephalic and atraumatic.  Eyes:     Conjunctiva/sclera: Conjunctivae normal.  Cardiovascular:     Rate and Rhythm: Normal rate and regular rhythm.     Heart sounds: No murmur.  Pulmonary:     Effort: Pulmonary effort is normal. No respiratory distress.     Breath sounds: Normal breath sounds.  Abdominal:     Palpations: Abdomen is soft.     Tenderness: There is abdominal tenderness  (diffuse). There is no guarding or rebound.  Musculoskeletal:        General: No deformity or signs of injury. Normal range of motion.     Cervical back: Neck supple.     Right lower leg: No edema.     Left lower leg: No edema.  Skin:    General: Skin is warm and dry.     Capillary Refill: Capillary refill takes less than 2 seconds.  Neurological:     General: No focal deficit present.     Mental Status: He is alert.     ED Results / Procedures / Treatments   Labs (all labs ordered are listed, but only abnormal results are displayed) Labs Reviewed  COMPREHENSIVE METABOLIC PANEL - Abnormal; Notable for the following components:      Result Value   CO2 20 (*)    Glucose, Bld 164 (*)    BUN 33 (*)    Creatinine, Ser 1.91 (*)    Calcium 10.9 (*)    Total Protein 10.7 (*)    Albumin 6.0 (*)    GFR calc non Af Amer 47 (*)    GFR calc Af Amer 54 (*)    Anion gap >20 (*)    All other components within normal limits  CBC WITH DIFFERENTIAL/PLATELET - Abnormal; Notable for the following components:   WBC 11.5 (*)    RBC 6.74 (*)    Hemoglobin 18.5 (*)    HCT 56.0 (*)    Neutro Abs 10.2 (*)    All other components within normal limits  RAPID URINE DRUG SCREEN, HOSP PERFORMED - Abnormal; Notable for the following components:   Cocaine POSITIVE (*)    Tetrahydrocannabinol POSITIVE (*)    All other components within normal limits  BASIC METABOLIC PANEL - Abnormal; Notable for the following components:   Sodium 147 (*)    BUN 25 (*)    Creatinine, Ser 1.43 (*)    Calcium 8.6 (*)    All other components within normal limits  SARS CORONAVIRUS 2 (TAT 6-24 HRS)  LIPASE, BLOOD  MAGNESIUM    EKG None  Radiology No results found.  Procedures Procedures (including critical care time)  Medications Ordered in ED Medications  sodium chloride 0.9 % bolus 1,000 mL (0 mLs Intravenous Stopped 09/23/19 1447)  droperidol (INAPSINE) 2.5 MG/ML injection 1.25 mg (1.25 mg Intravenous  Given 09/23/19 0940)  famotidine (PEPCID) IVPB 20 mg premix (0 mg Intravenous Stopped 09/23/19 1004)  sodium chloride 0.9 % bolus 1,000 mL (0 mLs Intravenous Stopped 09/23/19 1623)    ED Course  I have reviewed the triage vital signs and the nursing notes.  Pertinent labs & imaging results that were available during my care of the patient were reviewed by me and considered in my medical decision  making (see chart for details).  Clinical Course as of Sep 22 1728  Mon Sep 23, 2019  1004 ECG showing a rate of 81 with ventricular bigeminy.  QTC at 397.  LVH   [MB]  1102 Reviewed prior work-ups.  Patient has been here for similar complaints in the past.  Had a significant AKI in February with a creatinine of 4.  His creatinine is improved down to 1.0 few weeks ago but now is back elevated.  IV fluids infusing.   [MB]    Clinical Course User Index [MB] Terrilee Files, MD   MDM Rules/Calculators/A&P                     This patient complains of abdominal pain and vomiting; this involves an extensive number of treatment Options and is a complaint that carries with it a high risk of complications and Morbidity. The differential includes AKI, dehydration, metabolic derangement, gastritis, obstruction, cannabis hyperemesis.  I ordered, reviewed and interpreted labs, which included mild elevation of Stengel count 1.5.  Elevation of hemoglobin hematocrit likely secondary to dehydration.  Marked elevation of creatinine of 1.9.  Elevated.  Clear acidotic.  Lipase magnesium normal. I ordered medication IV fluids droperidol famotidine Previous records obtained and reviewed in epic, he has had multiple ED visits for presumptive cannabis induced hyperemesis.  During one of his visits his creatinine had risen to 4.  After the interventions stated above, I reevaluated the patient and found him to be tolerating p.o. with improvement in his symptoms.  Repeat chemistry showing his creatinine is improved.  Think  it would be reasonable to allow him to go home and continue to p.o. hydrate.  Counseled on abstinence of marijuana.  Return instructions discussed  Final Clinical Impression(s) / ED Diagnoses Final diagnoses:  AKI (acute kidney injury) (HCC)  Dehydration  Non-intractable vomiting with nausea, unspecified vomiting type  PVC (premature ventricular contraction)    Rx / DC Orders ED Discharge Orders         Ordered    ondansetron (ZOFRAN ODT) 4 MG disintegrating tablet  Status:  Discontinued     09/23/19 1635    promethazine (PHENERGAN) 25 MG suppository  Every 6 hours PRN,   Status:  Discontinued     09/23/19 1635    ondansetron (ZOFRAN ODT) 4 MG disintegrating tablet     09/23/19 1709    promethazine (PHENERGAN) 25 MG suppository  Every 6 hours PRN     09/23/19 1709           Terrilee Files, MD 09/23/19 1740

## 2019-09-23 NOTE — ED Provider Notes (Signed)
29 yo M with a chief complaint of intractable nausea and vomiting.  This been a recurrent issue for him.  Received in signout from Dr. Charm Barges, he was found to have acute kidney injury.  Plan was for fluid administration and reassessment.  Patient was given 4 L of IV fluids.  Recheck with hyponatremia likely due to the fluids that were administered.  Some dilution of the calcium level.  Renal function has improved.  Patient is also feeling better and feels that he is able to go home.  We will have him do a trial of Zofran then if he fails Phenergan suppositories.  PCP follow-up in 1 week for recheck of renal function.   Melene Plan, DO 09/23/19 1646

## 2019-09-24 LAB — SARS CORONAVIRUS 2 (TAT 6-24 HRS): SARS Coronavirus 2: NEGATIVE

## 2019-09-27 ENCOUNTER — Encounter (HOSPITAL_BASED_OUTPATIENT_CLINIC_OR_DEPARTMENT_OTHER): Payer: Self-pay | Admitting: Emergency Medicine

## 2019-09-27 ENCOUNTER — Other Ambulatory Visit: Payer: Self-pay

## 2019-09-27 ENCOUNTER — Emergency Department (HOSPITAL_BASED_OUTPATIENT_CLINIC_OR_DEPARTMENT_OTHER)
Admission: EM | Admit: 2019-09-27 | Discharge: 2019-09-28 | Disposition: A | Discharge status: Home / Self Care | Payer: Self-pay | Attending: Emergency Medicine | Admitting: Emergency Medicine

## 2019-09-27 DIAGNOSIS — R112 Nausea with vomiting, unspecified: Secondary | ICD-10-CM | POA: Insufficient documentation

## 2019-09-27 DIAGNOSIS — R1013 Epigastric pain: Secondary | ICD-10-CM | POA: Insufficient documentation

## 2019-09-27 DIAGNOSIS — Z79899 Other long term (current) drug therapy: Secondary | ICD-10-CM | POA: Insufficient documentation

## 2019-09-27 NOTE — ED Triage Notes (Signed)
Pt is c/o epigastric pain that started today  Pt states he has been seen here recently for same  Pt states he has not had any nausea, vomiting, or diarrhea  Pt states the pain is a pressure  Pt vomiting in triage

## 2019-09-28 MED ORDER — PROMETHAZINE HCL 25 MG PO TABS: 25.0000 mg | ORAL_TABLET | Freq: Four times a day (QID) | ORAL | 0 refills | Status: DC | PRN

## 2019-09-28 MED ORDER — HALOPERIDOL LACTATE 5 MG/ML IJ SOLN
2.0000 mg | Freq: Once | INTRAMUSCULAR | Status: AC
  Administered 2019-09-28: 2 mg via INTRAVENOUS
  Filled 2019-09-28: qty 1

## 2019-09-28 MED ORDER — SODIUM CHLORIDE 0.9 % IV BOLUS
1000.0000 mL | Freq: Once | INTRAVENOUS | Status: AC
  Administered 2019-09-28: 1000 mL via INTRAVENOUS

## 2019-09-28 NOTE — ED Provider Notes (Signed)
[facility] Provider Note: John Dell, MD, FACEP  CSN: [id number] MRN: [id number] ARRIVAL: 09/27/19 at 2247 ROOM: [facility]09/[facility]09   CHIEF COMPLAINT  Abdominal Pain   HISTORY OF PRESENT ILLNESS  09/28/19 12:05 AM John Beltran is a 29 y.o. male with a history of cannabis associated hyperemesis syndrome.  He is here with epigastric pain that started earlier today.  He describes the pain as pressure-like and rates it as an 8 out of 10, worse with movement or palpation.  He has had associated nausea and vomiting but denies diarrhea.  He denies using marijuana in over a week.   Past Medical History:  Diagnosis Date  . 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 Polysubstance abuse (HCC)     History reviewed. No pertinent surgical history.  History reviewed. No pertinent family history.  Social History   Tobacco Use  . Smoking status: Never Smoker  . Smokeless tobacco: Never Used  Substance Use Topics  . Alcohol use: No    Alcohol/week: 0.0 standard drinks  . Drug use: Yes    Types: Marijuana, Cocaine    Comment: benzodiazapine    Prior to Admission medications   Medication Sig Start Date End Date Taking? Authorizing Provider  promethazine (PHENERGAN) 25 MG tablet Take 1 tablet (25 mg total) by mouth every 6 (six) hours as needed for nausea or vomiting. 09/28/19   Deasha Clendenin, Jonny Ruiz, MD  famotidine (PEPCID) 20 MG tablet Take 1 tablet (20 mg total) by mouth 2 (two) times daily. Patient not taking: Reported on 07/29/2019 05/20/19 08/03/19  Alvira Monday, MD  omeprazole (PRILOSEC) 40 MG capsule Take 1 capsule at least 30 minutes prior to first daily dose of Carafate. 08/03/19 09/28/19  Avyan Livesay, Jonny Ruiz, MD  sucralfate (CARAFATE) 1 g tablet Take 1 tablet (1 g total) by mouth 4 (four) times daily -  with meals and at bedtime. 08/03/19 09/28/19  Landra Howze, Jonny Ruiz, MD    Allergies Patient has no known allergies.   REVIEW OF SYSTEMS  Negative  except as noted here or in the History of Present Illness.   PHYSICAL EXAMINATION  Initial Vital Signs Blood pressure (!) 140/97, pulse 84, resp. rate (!) 24, height 5\' 6"  (1.676 m), weight 54.4 kg, SpO2 98 %.  Examination General: Well-developed, well-nourished male in no acute distress; appearance consistent with age of record HENT: normocephalic; atraumatic Eyes: Normal appearance Neck: supple Heart: regular rate and rhythm Lungs: clear to auscultation bilaterally Abdomen: soft; nondistended; epigastric tenderness; no masses or hepatosplenomegaly; bowel sounds present Extremities: No deformity; full range of motion; pulses normal Neurologic: Awake, alert and oriented; motor function intact in all extremities and symmetric; no facial droop Skin: Warm and dry Psychiatric: Flat affect   RESULTS  Summary of this visit's results, reviewed and interpreted by myself:   EKG Interpretation  Date/Time:    Ventricular Rate:    PR Interval:    QRS Duration:   QT Interval:    QTC Calculation:   R Axis:     Text Interpretation:        Laboratory Studies: No results found for this or any previous visit (from the past 24 hour(s)). Imaging Studies: No results found.  ED COURSE and MDM  Nursing notes, initial and subsequent vitals signs, including pulse oximetry, reviewed and interpreted by myself.  Vitals:   09/27/19 2253 09/27/19 2356 09/28/19 0319 09/28/19 0512  BP:  (!) 140/97 (!) 142/91 124/89  Pulse:  84 76  90  Resp:  (!) 24 16 18   Temp:    98.7 F (37.1 C)  TempSrc:    Oral  SpO2:  98% 98% 97%  Weight: 54.4 kg     Height: 5\' 6"  (1.676 m)      Medications  sodium chloride 0.9 % bolus 1,000 mL ( Intravenous Stopped 09/28/19 0146)  haloperidol lactate (HALDOL) injection 2 mg (2 mg Intravenous Given 09/28/19 0045)    2:32 AM Patient sleeping after IV fluid bolus and Haldol 2 mg IV.  Patient refused lab work.  5:25 AM Patient, drinking fluids without difficulty.  I  suspect this represents continuation of cannabis associated hyperemesis syndrome.  Patient states he has been avoiding cannabis for a week.  As noted above he would not permit [place] to run lab test.   PROCEDURES  Procedures   ED DIAGNOSES     ICD-10-CM   1. Nausea and vomiting in adult  R11.2   2. Epigastric pain  R10.13        Rastus Borton, Jenny Reichmann, MD 09/28/19 [phone]

## 2019-09-28 NOTE — ED Notes (Signed)
Patient vomited x one while in room to attempt IV access. Patient refusing blood work; Dr [employer]AA aware. Patient requesting warm blankets; gave patient 2 warm blankets.

## 2019-09-28 NOTE — ED Notes (Signed)
Gave patient sprite for po challenge.  

## 2019-12-18 ENCOUNTER — Emergency Department (HOSPITAL_BASED_OUTPATIENT_CLINIC_OR_DEPARTMENT_OTHER)
Admission: EM | Admit: 2019-12-18 | Discharge: 2019-12-18 | Disposition: A | Discharge status: Home / Self Care | Payer: Self-pay | Attending: Emergency Medicine | Admitting: Emergency Medicine

## 2019-12-18 ENCOUNTER — Encounter (HOSPITAL_BASED_OUTPATIENT_CLINIC_OR_DEPARTMENT_OTHER): Payer: Self-pay | Admitting: *Deleted

## 2019-12-18 ENCOUNTER — Other Ambulatory Visit: Payer: Self-pay

## 2019-12-18 DIAGNOSIS — R112 Nausea with vomiting, unspecified: Secondary | ICD-10-CM | POA: Insufficient documentation

## 2019-12-18 DIAGNOSIS — F1299 Cannabis use, unspecified with unspecified cannabis-induced disorder: Secondary | ICD-10-CM | POA: Insufficient documentation

## 2019-12-18 DIAGNOSIS — Z5321 Procedure and treatment not carried out due to patient leaving prior to being seen by health care provider: Secondary | ICD-10-CM | POA: Insufficient documentation

## 2019-12-18 NOTE — ED Notes (Signed)
Pt in waiting  calling someone to come get him and take him some where else

## 2019-12-18 NOTE — ED Triage Notes (Addendum)
Brought in from home by EMS , c/o abd pain with n/v. cannabis use today, per EMS pt refused cbg, refused to dress self at home and crawled to stretcher from floor.

## 2019-12-18 NOTE — ED Notes (Signed)
Greeter states pt left amb to car

## 2020-01-09 ENCOUNTER — Emergency Department (HOSPITAL_BASED_OUTPATIENT_CLINIC_OR_DEPARTMENT_OTHER)
Admission: EM | Admit: 2020-01-09 | Discharge: 2020-01-09 | Discharge status: Against Medical Advice | Payer: Self-pay | Attending: Emergency Medicine | Admitting: Emergency Medicine

## 2020-01-09 ENCOUNTER — Other Ambulatory Visit: Payer: Self-pay

## 2020-01-09 ENCOUNTER — Encounter (HOSPITAL_BASED_OUTPATIENT_CLINIC_OR_DEPARTMENT_OTHER): Payer: Self-pay

## 2020-01-09 DIAGNOSIS — F1290 Cannabis use, unspecified, uncomplicated: Secondary | ICD-10-CM | POA: Insufficient documentation

## 2020-01-09 DIAGNOSIS — F17210 Nicotine dependence, cigarettes, uncomplicated: Secondary | ICD-10-CM | POA: Insufficient documentation

## 2020-01-09 DIAGNOSIS — R1084 Generalized abdominal pain: Secondary | ICD-10-CM | POA: Insufficient documentation

## 2020-01-09 DIAGNOSIS — R9431 Abnormal electrocardiogram [ECG] [EKG]: Secondary | ICD-10-CM | POA: Insufficient documentation

## 2020-01-09 DIAGNOSIS — R112 Nausea with vomiting, unspecified: Secondary | ICD-10-CM | POA: Insufficient documentation

## 2020-01-09 LAB — CBC WITH DIFFERENTIAL/PLATELET
Abs Immature Granulocytes: 0.03 10*3/uL (ref 0.00–0.07)
Basophils Absolute: 0 10*3/uL (ref 0.0–0.1)
Basophils Relative: 0 %
Eosinophils Absolute: 0 10*3/uL (ref 0.0–0.5)
Eosinophils Relative: 0 %
HCT: 54.4 % — ABNORMAL HIGH (ref 39.0–52.0)
Hemoglobin: 18.6 g/dL — ABNORMAL HIGH (ref 13.0–17.0)
Immature Granulocytes: 0 %
Lymphocytes Relative: 20 %
Lymphs Abs: 1.8 10*3/uL (ref 0.7–4.0)
MCH: 26.6 pg (ref 26.0–34.0)
MCHC: 34.2 g/dL (ref 30.0–36.0)
MCV: 77.8 fL — ABNORMAL LOW (ref 80.0–100.0)
Monocytes Absolute: 0.6 10*3/uL (ref 0.1–1.0)
Monocytes Relative: 7 %
Neutro Abs: 6.4 10*3/uL (ref 1.7–7.7)
Neutrophils Relative %: 73 %
Platelets: 311 10*3/uL (ref 150–400)
RBC: 6.99 MIL/uL — ABNORMAL HIGH (ref 4.22–5.81)
RDW: 15.6 % — ABNORMAL HIGH (ref 11.5–15.5)
WBC: 8.8 10*3/uL (ref 4.0–10.5)
nRBC: 0 % (ref 0.0–0.2)

## 2020-01-09 LAB — LIPASE, BLOOD: Lipase: 20 U/L (ref 11–51)

## 2020-01-09 MED ORDER — METOCLOPRAMIDE HCL 5 MG/ML IJ SOLN
5.0000 mg | Freq: Once | INTRAMUSCULAR | Status: AC
  Administered 2020-01-09: 5 mg via INTRAVENOUS
  Filled 2020-01-09: qty 2

## 2020-01-09 MED ORDER — SODIUM CHLORIDE 0.9 % IV BOLUS
2000.0000 mL | Freq: Once | INTRAVENOUS | Status: AC
  Administered 2020-01-09: 2000 mL via INTRAVENOUS

## 2020-01-09 NOTE — ED Provider Notes (Addendum)
[facility] EMERGENCY DEPARTMENT Provider Note   CSN: [id number] Arrival date & time: 01/09/20  [id number]    History Chief Complaint  Patient presents with  . Abdominal Pain   John Beltran is a 29 y.o. male with past medical history significant for cannabinoid hyperemesis who presents for evaluation of vomiting.  Patient with multiple episodes of NBNB vomiting over the last 4 days.  Admits to daily marijuana use.  Last bowel movement 2 days ago.  He is passing flatulence.  He has no prior abdominal surgeries.  Has some generalized cramping to his abdomen which started after his vomiting.  Denies fever, chills, chest pain, shortness of breath, dysuria, diarrhea.  Unilateral leg swelling, redness or warmth.  States feels similar to prior episodes of cannabinoid hyperemesis.  Denies chronic alcohol use, chronic NSAID use, additional substances. No recent trauma.   History obtained from patient and past medical records. No interpretor was used.  HPI     Past Medical History:  Diagnosis Date  . 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 Polysubstance abuse [facility](HCC)     Patient Active Problem List   Diagnosis Date Noted  . Nausea & vomiting 06/13/2019  . ALLERGIC RHINITIS 09/29/2009    No past surgical history on file.     No family history on file.  Social History   Tobacco Use  . Smoking status: Current Every Day Smoker    Types: Cigarettes  . Smokeless tobacco: Never Used  Vaping Use  . Vaping Use: Never used  Substance Use Topics  . Alcohol use: No    Alcohol/week: 0.0 standard drinks  . Drug use: Yes    Types: Marijuana, Cocaine    Comment: benzodiazapine    Home Medications Prior to Admission medications   Medication Sig Start Date End Date Taking? Authorizing Provider  promethazine (PHENERGAN) 25 MG tablet Take 1 tablet (25 mg total) by mouth every 6 (six) hours as needed for nausea or vomiting. 09/28/19   Molpus,  Jonny RuizJohn, MD  famotidine (PEPCID) 20 MG tablet Take 1 tablet (20 mg total) by mouth 2 (two) times daily. Patient not taking: Reported on 07/29/2019 05/20/19 08/03/19  Alvira MondaySchlossman, Erin, MD  omeprazole (PRILOSEC) 40 MG capsule Take 1 capsule at least 30 minutes prior to first daily dose of Carafate. 08/03/19 09/28/19  Molpus, Jonny RuizJohn, MD  sucralfate (CARAFATE) 1 g tablet Take 1 tablet (1 g total) by mouth 4 (four) times daily -  with meals and at bedtime. 08/03/19 09/28/19  Molpus, Jonny RuizJohn, MD    Allergies    Patient has no known allergies.  Review of Systems   Review of Systems  Constitutional: Negative.   HENT: Negative.   Respiratory: Negative.   Cardiovascular: Negative.   Gastrointestinal: Positive for abdominal pain (Cramping), nausea and vomiting. Negative for abdominal distention, anal bleeding, blood in stool, constipation, diarrhea and rectal pain.  Genitourinary: Negative.   Musculoskeletal: Negative.   Skin: Negative.   Neurological: Negative.   All other systems reviewed and are negative.   Physical Exam Updated Vital Signs BP (!) 149/87 (BP Location: Right Arm)   Pulse (!) 107   Temp (!) 97.5 F (36.4 C) (Oral)   Resp (!) 23   Ht 5\' 6"  (1.676 m)   Wt 54.4 kg   SpO2 100%   BMI 19.37 kg/m   Physical Exam Vitals and nursing note reviewed.  Constitutional:      General: He is not in  acute distress.    Appearance: He is well-developed. He is not ill-appearing, toxic-appearing or diaphoretic.     Comments: Active emesis on exam  HENT:     Head: Normocephalic and atraumatic.     Mouth/Throat:     Mouth: Mucous membranes are moist.  Eyes:     Pupils: Pupils are equal, round, and reactive to light.  Cardiovascular:     Rate and Rhythm: Normal rate and regular rhythm.     Heart sounds: Normal heart sounds.  Pulmonary:     Effort: Pulmonary effort is normal. No respiratory distress.     Breath sounds: Normal breath sounds.  Abdominal:     General: Bowel sounds are normal.  There is no distension.     Palpations: Abdomen is soft.     Tenderness: There is generalized abdominal tenderness. There is no right CVA tenderness, left CVA tenderness, guarding or rebound. Negative signs include Murphy's sign and McBurney's sign.     Hernia: No hernia is present.  Musculoskeletal:        General: Normal range of motion.     Cervical back: Normal range of motion and neck supple.  Skin:    General: Skin is warm and dry.     Capillary Refill: Capillary refill takes less than 2 seconds.  Neurological:     General: No focal deficit present.     Mental Status: He is alert and oriented to person, place, and time.    ED Results / Procedures / Treatments   Labs (all labs ordered are listed, but only abnormal results are displayed) Labs Reviewed  CBC WITH DIFFERENTIAL/PLATELET - Abnormal; Notable for the following components:      Result Value   RBC 6.99 (*)    Hemoglobin 18.6 (*)    HCT 54.4 (*)    MCV 77.8 (*)    RDW 15.6 (*)    All other components within normal limits  COMPREHENSIVE METABOLIC PANEL - Abnormal; Notable for the following components:   Potassium 3.4 (*)    Chloride 88 (*)    Glucose, Bld 103 (*)    BUN 67 (*)    Creatinine, Ser 4.07 (*)    GFR calc non Af Amer 19 (*)    GFR calc Af Amer 22 (*)    Anion gap 19 (*)    All other components within normal limits  LIPASE, BLOOD  URINALYSIS, ROUTINE W REFLEX MICROSCOPIC    EKG EKG Interpretation  Date/Time:  Thursday January 09 2020 10:13:37 EDT Ventricular Rate:  128 PR Interval:    QRS Duration: 77 QT Interval:  412 QTC Calculation: 602 R Axis:   89 Text Interpretation: Sinus tachycardia LAE, consider biatrial enlargement nonspecific T waves. Prolonged QT interval rate is faster compared to Feb 2021 Confirmed by Pricilla Loveless [phone]) on 01/09/2020 10:16:13 AM  Radiology No results found.  Procedures Procedures (including critical care time)  Medications Ordered in ED Medications    sodium chloride 0.9 % bolus 2,000 mL (0 mLs Intravenous Stopped 01/09/20 1220)  metoCLOPramide (REGLAN) injection 5 mg (5 mg Intravenous Given 01/09/20 1041)   ED Course  I have reviewed the triage vital signs and the nursing notes.  Pertinent labs & imaging results that were available during my care of the patient were reviewed by me and considered in my medical decision making (see chart for details).  29 year old presents for evaluation of emesis.  No, nonseptic, not ill-appearing.  History of cannabinoid hyperemesis.  Continues  to smoke marijuana daily.  He is afebrile here and is actively vomiting.  Tachycardic to 130 however denies chest pain, shortness of breath.  No unilateral leg swelling, redness or warmth.  No history of clotting disorders, PE, DVT.  States he does he does have some generalized abdominal cramping which began after the emesis.  Emesis NBNB.  Passing flatulence.  No prior abdominal surgeries or trauma.  Abdomen soft without rebound or guarding.  There is some mild generalized tenderness however no focal tenderness.  No melena or bright red blood per rectum.  No chronic NSAID use or chronic alcohol use.  Plan on labs, imaging and reassess  Labs and imaging personally reviewed and interpreted: CBC without leukocytosis, Hgb 18.6 similar to previous, question hemoconcentration Lipase 20 CMP pending at leaving AMA UA Pending at leaving AMA EKG: Tachycardia, No STEMI, Prolonged QT interval, hold on Haldol and Droperidol.  Patient reassessed.  Unfortunately have had multiple failed IV attempts and clotted blood work.  Nursing was eventually able to start IV and he has gotten 2 L of fluids.  His tachycardia has improved.  He is still dry heaving. We are pending lab work and repeat EKG given prolonged QT interval.  Nursing has notified me that patient stating he is leaving.  I went in to reassess patient.  He is dry heaving.  States "you cannot keep me here.". He is grabbing at his  IV stating "if you do not take this out I am going to pull it out myself.   Discussed with nursing and tech in room of leaving AGAINST MEDICAL ADVICE.  He is pending lab work at this time however I am concern for AKI given multiple episodes of emesis with AKI in the past. Discussed this with patient.  He appears to have a capacity to make medical decisions.  Patient leaving AGAINST MEDICAL ADVICE.  Nursing has had patient sign the paperwork to leave AGAINST MEDICAL ADVICE. Patient ambulated out the ED with steady gait and without difficulty.   We discussed the nature and purpose, risks and benefits, as well as, the alternatives of treatment. Time was given to allow the opportunity to ask questions and consider their options, and after the discussion, the patient decided to refuse the offerred treatment. The patient was informed that refusal could lead to, but was not limited to, death, permanent disability, or severe pain. If present, I asked the relatives or significant others to dissuade them without success. Prior to refusing, I determined that the patient had the capacity to make their decision and understood the consequences of that decision. After refusal, I made every reasonable opportunity to treat them to the best of my ability.  The patient was notified that they may return to the emergency department at any time for further treatment.  Patient has left AGAINST MEDICAL ADVISE.  ADDEND: 1310 Patients Metabolic Panel resulted with severe abnormalities including new onset acute renal failure with Creatinine of 4.07  I attempted to call multiple numbers in EPIC including [phone] to inform patient to return to the ED for treatment however voice mail box has not been set up on Cell phone and Home phone at [phone] which rang without going to voice mail box.     MDM Rules/Calculators/A&P                           Final Clinical Impression(s) / ED Diagnoses Final diagnoses:  Prolonged  Q-T interval on ECG  Nausea and vomiting, intractability of vomiting not specified, unspecified vomiting type  Marijuana use    Rx / DC Orders ED Discharge Orders    None       Eniola Cerullo A, PA-C 01/09/20 1230    Uriah Trueba A, PA-C 01/09/20 1338    Pricilla Loveless, MD 01/09/20 1347

## 2020-01-09 NOTE — ED Notes (Signed)
Pt aware of need for urine collection, unable to provide at this time.  IV fluids infusing.

## 2020-01-09 NOTE — ED Triage Notes (Signed)
Pt arrives to ED POV, not answering questions in triage in any detail, continues to state "I need fluids" and "I have kidney problems".

## 2020-01-13 LAB — COMPREHENSIVE METABOLIC PANEL
ALT: 16 U/L (ref 0–44)
AST: 18 U/L (ref 15–41)
Albumin: 4.6 g/dL (ref 3.5–5.0)
Alkaline Phosphatase: 82 U/L (ref 38–126)
Anion gap: 19 — ABNORMAL HIGH (ref 5–15)
BUN: 67 mg/dL — ABNORMAL HIGH (ref 6–20)
CO2: 32 mmol/L (ref 22–32)
Calcium: 9.4 mg/dL (ref 8.9–10.3)
Chloride: 88 mmol/L — ABNORMAL LOW (ref 98–111)
Creatinine, Ser: 4.07 mg/dL — ABNORMAL HIGH (ref 0.61–1.24)
GFR calc Af Amer: 22 mL/min — ABNORMAL LOW (ref >60)
GFR calc non Af Amer: 19 mL/min — ABNORMAL LOW (ref >60)
Glucose, Bld: 103 mg/dL — ABNORMAL HIGH (ref 70–99)
Potassium: 3.4 mmol/L — ABNORMAL LOW (ref 3.5–5.1)
Sodium: 139 mmol/L (ref 135–145)
Total Bilirubin: 0.7 mg/dL (ref 0.3–1.2)
Total Protein: 8.1 g/dL (ref 6.5–8.1)

## 2020-03-18 ENCOUNTER — Encounter (HOSPITAL_BASED_OUTPATIENT_CLINIC_OR_DEPARTMENT_OTHER): Payer: Self-pay

## 2020-03-18 ENCOUNTER — Other Ambulatory Visit: Payer: Self-pay

## 2020-03-18 ENCOUNTER — Emergency Department (HOSPITAL_BASED_OUTPATIENT_CLINIC_OR_DEPARTMENT_OTHER)
Admission: EM | Admit: 2020-03-18 | Discharge: 2020-03-19 | Disposition: A | Discharge status: Home / Self Care | Payer: Self-pay | Attending: Emergency Medicine | Admitting: Emergency Medicine

## 2020-03-18 DIAGNOSIS — Z87891 Personal history of nicotine dependence: Secondary | ICD-10-CM | POA: Insufficient documentation

## 2020-03-18 DIAGNOSIS — E860 Dehydration: Secondary | ICD-10-CM | POA: Insufficient documentation

## 2020-03-18 DIAGNOSIS — R112 Nausea with vomiting, unspecified: Secondary | ICD-10-CM | POA: Insufficient documentation

## 2020-03-18 DIAGNOSIS — F1910 Other psychoactive substance abuse, uncomplicated: Secondary | ICD-10-CM | POA: Insufficient documentation

## 2020-03-18 LAB — CBC WITH DIFFERENTIAL/PLATELET
Abs Immature Granulocytes: 0.03 10*3/uL (ref 0.00–0.07)
Basophils Absolute: 0 10*3/uL (ref 0.0–0.1)
Basophils Relative: 1 %
Eosinophils Absolute: 0 10*3/uL (ref 0.0–0.5)
Eosinophils Relative: 0 %
HCT: 53.1 % — ABNORMAL HIGH (ref 39.0–52.0)
Hemoglobin: 18.2 g/dL — ABNORMAL HIGH (ref 13.0–17.0)
Immature Granulocytes: 1 %
Lymphocytes Relative: 25 %
Lymphs Abs: 1.7 10*3/uL (ref 0.7–4.0)
MCH: 27.1 pg (ref 26.0–34.0)
MCHC: 34.3 g/dL (ref 30.0–36.0)
MCV: 79.1 fL — ABNORMAL LOW (ref 80.0–100.0)
Monocytes Absolute: 0.6 10*3/uL (ref 0.1–1.0)
Monocytes Relative: 10 %
Neutro Abs: 4.2 10*3/uL (ref 1.7–7.7)
Neutrophils Relative %: 63 %
Platelets: 424 10*3/uL — ABNORMAL HIGH (ref 150–400)
RBC: 6.71 MIL/uL — ABNORMAL HIGH (ref 4.22–5.81)
RDW: 16.8 % — ABNORMAL HIGH (ref 11.5–15.5)
WBC: 6.6 10*3/uL (ref 4.0–10.5)
nRBC: 0 % (ref 0.0–0.2)

## 2020-03-18 LAB — COMPREHENSIVE METABOLIC PANEL
ALT: 14 U/L (ref 0–44)
AST: 20 U/L (ref 15–41)
Albumin: 4.1 g/dL (ref 3.5–5.0)
Alkaline Phosphatase: 80 U/L (ref 38–126)
Anion gap: 18 — ABNORMAL HIGH (ref 5–15)
BUN: 39 mg/dL — ABNORMAL HIGH (ref 6–20)
CO2: 30 mmol/L (ref 22–32)
Calcium: 8.9 mg/dL (ref 8.9–10.3)
Chloride: 88 mmol/L — ABNORMAL LOW (ref 98–111)
Creatinine, Ser: 1.46 mg/dL — ABNORMAL HIGH (ref 0.61–1.24)
GFR, Estimated: >60 mL/min (ref >60)
Glucose, Bld: 100 mg/dL — ABNORMAL HIGH (ref 70–99)
Potassium: 2.9 mmol/L — ABNORMAL LOW (ref 3.5–5.1)
Sodium: 136 mmol/L (ref 135–145)
Total Bilirubin: 1 mg/dL (ref 0.3–1.2)
Total Protein: 7.8 g/dL (ref 6.5–8.1)

## 2020-03-18 MED ORDER — LACTATED RINGERS IV BOLUS
2000.0000 mL | Freq: Once | INTRAVENOUS | Status: AC
  Administered 2020-03-18: 2000 mL via INTRAVENOUS

## 2020-03-18 MED ORDER — HALOPERIDOL LACTATE 5 MG/ML IJ SOLN
2.0000 mg | Freq: Once | INTRAMUSCULAR | Status: AC
  Administered 2020-03-18: 2 mg via INTRAVENOUS
  Filled 2020-03-18: qty 1

## 2020-03-18 MED ORDER — CLONIDINE HCL 0.1 MG PO TABS
0.1000 mg | ORAL_TABLET | Freq: Once | ORAL | Status: AC
  Administered 2020-03-18: 0.1 mg via ORAL
  Filled 2020-03-18: qty 1

## 2020-03-18 NOTE — ED Triage Notes (Signed)
Pt c/o n/v x 4 days-states "I think I'm withdrawing from fentanyl"-NAD-to triage in w/c

## 2020-03-18 NOTE — ED Provider Notes (Addendum)
[facility] Provider Note: [name], MD, FACEP  CSN: [id number] MRN: [id number] ARRIVAL: 03/18/20 at 2111 ROOM: [facility]10/[facility]10   CHIEF COMPLAINT  Vomiting   HISTORY OF PRESENT ILLNESS  03/18/20 10:53 PM John Beltran is a 29 y.o. male with a history of polysubstance abuse.  He is here with 5 days of nausea and vomiting and no bowel movement.  He believes he is withdrawing from fentanyl.  He had been using fentanyl powder on a regular basis.  He denies marijuana use recently but does have a history of cannabis hyperemesis syndrome.  He is complaining of cramping pain in his abdomen and back which he rates as an 8 out of 10.  The emesis is bilious.  He was noted to be tachycardic on arrival.   Past Medical History:  Diagnosis Date  . 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 Polysubstance abuse (HCC)     History reviewed. No pertinent surgical history.  No family history on file.  Social History   Tobacco Use  . Smoking status: Former Smoker    Types: Cigarettes  . Smokeless tobacco: Never Used  Vaping Use  . Vaping Use: Never used  Substance Use Topics  . Alcohol use: No    Alcohol/week: 0.0 standard drinks  . Drug use: Not Currently    Types: Marijuana, Cocaine    Comment: benzodiazapine    Prior to Admission medications   Medication Sig Start Date End Date Taking? Authorizing Provider  potassium chloride SA (KLOR-CON) 20 MEQ tablet Take 1 tablet (20 mEq total) by mouth 2 (two) times daily. 03/19/20   Glora Hulgan, MD  promethazine (PHENERGAN) 25 MG tablet Take 1 tablet (25 mg total) by mouth every 6 (six) hours as needed for nausea or vomiting. 03/19/20   Aubrionna Istre, MD  famotidine (PEPCID) 20 MG tablet Take 1 tablet (20 mg total) by mouth 2 (two) times daily. Patient not taking: Reported on 07/29/2019 05/20/19 08/03/19  Alvira Monday, MD  omeprazole (PRILOSEC) 40 MG capsule Take 1 capsule at least 30 minutes  prior to first daily dose of Carafate. 08/03/19 09/28/19  Kirtis Challis, Jonny Ruiz, MD  sucralfate (CARAFATE) 1 g tablet Take 1 tablet (1 g total) by mouth 4 (four) times daily -  with meals and at bedtime. 08/03/19 09/28/19  Myrtice Lowdermilk, Jonny Ruiz, MD    Allergies Patient has no known allergies.   REVIEW OF SYSTEMS  Negative except as noted here or in the History of Present Illness.   PHYSICAL EXAMINATION  Initial Vital Signs Blood pressure (!) 125/98, pulse (!) 115, temperature 98.5 F (36.9 C), temperature source Oral, resp. rate 18, height 5\' 6"  (1.676 m), weight 54.4 kg, SpO2 100 %.  Examination General: Well-developed, well-nourished male in no acute distress; appearance consistent with age of record HENT: normocephalic; atraumatic Eyes: pupils equal, round and reactive to light; extraocular muscles intact Neck: supple Heart: regular rate and rhythm; tachycardia Lungs: clear to auscultation bilaterally; tachypnea Abdomen: soft; nondistended; mild diffuse tenderness; no masses or hepatosplenomegaly; bowel sounds present Extremities: No deformity; full range of motion; pulses normal Neurologic: Awake, alert and oriented; motor function intact in all extremities and symmetric; no facial droop Skin: Warm and dry Psychiatric: Mildly agitated and restless   RESULTS  Summary of this visit's results, reviewed and interpreted by myself:   EKG Interpretation  Date/Time:    Ventricular Rate:    PR Interval:    QRS Duration:   QT  Interval:    QTC Calculation:   R Axis:     Text Interpretation:        Laboratory Studies: Results for orders placed or performed during the hospital encounter of 03/18/20 (from the past 24 hour(s))  CBC with Differential/Platelet     Status: Abnormal   Collection Time: 03/18/20 10:58 PM  Result Value Ref Range   WBC 6.6 4.0 - 10.5 K/uL   RBC 6.71 (H) 4.22 - 5.81 MIL/uL   Hemoglobin 18.2 (H) 13.0 - 17.0 g/dL   HCT 28.4 (H) 39 - 52 %   MCV 79.1 (L) 80.0 - 100.0 fL    MCH 27.1 26.0 - 34.0 pg   MCHC 34.3 30.0 - 36.0 g/dL   RDW 13.2 (H) 44.0 - 10.2 %   Platelets 424 (H) 150 - 400 K/uL   nRBC 0.0 0.0 - 0.2 %   Neutrophils Relative % 63 %   Neutro Abs 4.2 1.7 - 7.7 K/uL   Lymphocytes Relative 25 %   Lymphs Abs 1.7 0.7 - 4.0 K/uL   Monocytes Relative 10 %   Monocytes Absolute 0.6 0.1 - 1.0 K/uL   Eosinophils Relative 0 %   Eosinophils Absolute 0.0 0.0 - 0.5 K/uL   Basophils Relative 1 %   Basophils Absolute 0.0 0.0 - 0.1 K/uL   Immature Granulocytes 1 %   Abs Immature Granulocytes 0.03 0.00 - 0.07 K/uL  Comprehensive metabolic panel     Status: Abnormal   Collection Time: 03/18/20 10:58 PM  Result Value Ref Range   Sodium 136 135 - 145 mmol/L   Potassium 2.9 (L) 3.5 - 5.1 mmol/L   Chloride 88 (L) 98 - 111 mmol/L   CO2 30 22 - 32 mmol/L   Glucose, Bld 100 (H) 70 - 99 mg/dL   BUN 39 (H) 6 - 20 mg/dL   Creatinine, Ser 7.25 (H) 0.61 - 1.24 mg/dL   Calcium 8.9 8.9 - 36.6 mg/dL   Total Protein 7.8 6.5 - 8.1 g/dL   Albumin 4.1 3.5 - 5.0 g/dL   AST 20 15 - 41 U/L   ALT 14 0 - 44 U/L   Alkaline Phosphatase 80 38 - 126 U/L   Total Bilirubin 1.0 0.3 - 1.2 mg/dL   GFR, Estimated >44 >03 mL/min   Anion gap 18 (H) 5 - 15   Imaging Studies: No results found.  ED COURSE and MDM  Nursing notes, initial and subsequent vitals signs, including pulse oximetry, reviewed and interpreted by myself.  Vitals:   03/18/20 2311 03/19/20 0024 03/19/20 0224 03/19/20 0427  BP: (!) 125/98 137/85 132/84 113/87  Pulse:  73 78 77  Resp:  16 16 16   Temp:      TempSrc:      SpO2:  99% 99% 98%  Weight:      Height:       Medications  0.9 %  sodium chloride infusion ( Intravenous Stopped 03/19/20 0222)  lactated ringers bolus 2,000 mL (0 mLs Intravenous Stopped 03/19/20 0030)  haloperidol lactate (HALDOL) injection 2 mg (2 mg Intravenous Given 03/18/20 2311)  cloNIDine (CATAPRES) tablet 0.1 mg (0.1 mg Oral Given 03/18/20 2311)  potassium chloride 10 mEq in 100 mL  IVPB ( Intravenous Stopped 03/19/20 0221)  potassium chloride SA (KLOR-CON) CR tablet 40 mEq (40 mEq Oral Given 03/19/20 0022)   5:03 AM Patient states he feels better after IV fluids and Haldol.  He has been somnolent but arousable.  Vital signs have normalized.  He has been able to swallow liquids without vomiting.  We are still awaiting a urine specimen.  6:11 AM Patient given IV and oral potassium supplementation.  We will discharge with additional potassium.  Although the patient denies recent marijuana use this could represent cannabis associated hyperemesis syndrome as he has been treated for that in the past.   PROCEDURES  Procedures   ED DIAGNOSES     ICD-10-CM   1. Nausea and vomiting in adult  R11.2   2. Dehydration  E86.0   3. Substance abuse (HCC)  F19.10        Dezi Schaner, MD 03/19/20 0612    Paula Libra, MD 03/19/20 [phone]

## 2020-03-19 ENCOUNTER — Encounter (HOSPITAL_BASED_OUTPATIENT_CLINIC_OR_DEPARTMENT_OTHER): Payer: Self-pay | Admitting: Emergency Medicine

## 2020-03-19 MED ORDER — POTASSIUM CHLORIDE 10 MEQ/100ML IV SOLN
10.0000 meq | INTRAVENOUS | Status: AC
  Administered 2020-03-19 (×2): 10 meq via INTRAVENOUS
  Filled 2020-03-19 (×2): qty 100

## 2020-03-19 MED ORDER — SODIUM CHLORIDE 0.9 % IV SOLN
INTRAVENOUS | Status: DC | PRN
  Administered 2020-03-19: 1000 mL via INTRAVENOUS

## 2020-03-19 MED ORDER — PROMETHAZINE HCL 25 MG PO TABS
25.0000 mg | ORAL_TABLET | Freq: Four times a day (QID) | ORAL | 0 refills | Status: DC | PRN
Start: 2020-03-19 — End: 2020-08-18

## 2020-03-19 MED ORDER — POTASSIUM CHLORIDE CRYS ER 20 MEQ PO TBCR
40.0000 meq | EXTENDED_RELEASE_TABLET | Freq: Once | ORAL | Status: AC
  Administered 2020-03-19: 40 meq via ORAL
  Filled 2020-03-19: qty 2

## 2020-03-19 MED ORDER — POTASSIUM CHLORIDE CRYS ER 20 MEQ PO TBCR
20.0000 meq | EXTENDED_RELEASE_TABLET | Freq: Two times a day (BID) | ORAL | 0 refills | Status: DC
Start: 2020-03-19 — End: 2020-08-18

## 2020-03-19 NOTE — ED Notes (Signed)
No emesis in past hour. Pt sleeping. States unable to urinate at this time.

## 2020-03-19 NOTE — ED Notes (Signed)
  Went to reassess patient and ask him to obtain a urine specimen.  Patient states he still feels bad but better than earlier.  Was able to tolerate PO fluids well with no vomiting.  Was asked again to obtain urine specimen but stated he could not but would try.

## 2020-03-19 NOTE — ED Notes (Signed)
Pt asleep. Encouraged to drink PO fluids and give urine sample. NO emesis since arrival.

## 2020-08-17 ENCOUNTER — Emergency Department (HOSPITAL_BASED_OUTPATIENT_CLINIC_OR_DEPARTMENT_OTHER)
Admission: EM | Admit: 2020-08-17 | Discharge: 2020-08-18 | Disposition: A | Discharge status: Home / Self Care | Payer: Self-pay | Attending: Emergency Medicine | Admitting: Emergency Medicine

## 2020-08-17 ENCOUNTER — Other Ambulatory Visit: Payer: Self-pay

## 2020-08-17 DIAGNOSIS — F1290 Cannabis use, unspecified, uncomplicated: Secondary | ICD-10-CM | POA: Insufficient documentation

## 2020-08-17 DIAGNOSIS — Z87891 Personal history of nicotine dependence: Secondary | ICD-10-CM | POA: Insufficient documentation

## 2020-08-17 DIAGNOSIS — E860 Dehydration: Secondary | ICD-10-CM | POA: Insufficient documentation

## 2020-08-17 DIAGNOSIS — I493 Ventricular premature depolarization: Secondary | ICD-10-CM

## 2020-08-17 DIAGNOSIS — R1110 Vomiting, unspecified: Secondary | ICD-10-CM | POA: Insufficient documentation

## 2020-08-17 DIAGNOSIS — R1030 Lower abdominal pain, unspecified: Secondary | ICD-10-CM | POA: Insufficient documentation

## 2020-08-17 LAB — COMPREHENSIVE METABOLIC PANEL
ALT: 18 U/L (ref 0–44)
AST: 31 U/L (ref 15–41)
Albumin: 6 g/dL — ABNORMAL HIGH (ref 3.5–5.0)
Alkaline Phosphatase: 94 U/L (ref 38–126)
Anion gap: 17 — ABNORMAL HIGH (ref 5–15)
BUN: 37 mg/dL — ABNORMAL HIGH (ref 6–20)
CO2: 27 mmol/L (ref 22–32)
Calcium: 10.5 mg/dL — ABNORMAL HIGH (ref 8.9–10.3)
Chloride: 97 mmol/L — ABNORMAL LOW (ref 98–111)
Creatinine, Ser: 1.51 mg/dL — ABNORMAL HIGH (ref 0.61–1.24)
GFR, Estimated: >60 mL/min (ref >60)
Glucose, Bld: 102 mg/dL — ABNORMAL HIGH (ref 70–99)
Potassium: 3.5 mmol/L (ref 3.5–5.1)
Sodium: 141 mmol/L (ref 135–145)
Total Bilirubin: 0.9 mg/dL (ref 0.3–1.2)
Total Protein: 10.4 g/dL — ABNORMAL HIGH (ref 6.5–8.1)

## 2020-08-17 LAB — CBC
HCT: 56.6 % — ABNORMAL HIGH (ref 39.0–52.0)
Hemoglobin: 18.7 g/dL — ABNORMAL HIGH (ref 13.0–17.0)
MCH: 26.7 pg (ref 26.0–34.0)
MCHC: 33 g/dL (ref 30.0–36.0)
MCV: 80.7 fL (ref 80.0–100.0)
Platelets: 328 10*3/uL (ref 150–400)
RBC: 7.01 MIL/uL — ABNORMAL HIGH (ref 4.22–5.81)
RDW: 16.7 % — ABNORMAL HIGH (ref 11.5–15.5)
WBC: 7 10*3/uL (ref 4.0–10.5)
nRBC: 0 % (ref 0.0–0.2)

## 2020-08-17 LAB — LIPASE, BLOOD: Lipase: 28 U/L (ref 11–51)

## 2020-08-17 MED ORDER — ONDANSETRON HCL 4 MG/2ML IJ SOLN
4.0000 mg | Freq: Once | INTRAMUSCULAR | Status: AC | PRN
  Administered 2020-08-17: 4 mg via INTRAVENOUS
  Filled 2020-08-17: qty 2

## 2020-08-17 NOTE — ED Triage Notes (Signed)
Pt vomiting x 2 days, not using the bathroom or eating since

## 2020-08-18 MED ORDER — PROMETHAZINE HCL 25 MG PO TABS
25.0000 mg | ORAL_TABLET | Freq: Four times a day (QID) | ORAL | 0 refills | Status: DC | PRN
Start: 2020-08-18 — End: 2020-11-04

## 2020-08-18 MED ORDER — PROMETHAZINE HCL 25 MG RE SUPP
25.0000 mg | Freq: Four times a day (QID) | RECTAL | 0 refills | Status: DC | PRN
Start: 2020-08-18 — End: 2020-11-04

## 2020-08-18 MED ORDER — HALOPERIDOL LACTATE 5 MG/ML IJ SOLN
5.0000 mg | Freq: Once | INTRAMUSCULAR | Status: AC
  Administered 2020-08-18: 5 mg via INTRAVENOUS
  Filled 2020-08-18: qty 1

## 2020-08-18 MED ORDER — LACTATED RINGERS IV BOLUS
2000.0000 mL | Freq: Once | INTRAVENOUS | Status: AC
  Administered 2020-08-18: 2000 mL via INTRAVENOUS

## 2020-08-18 NOTE — ED Provider Notes (Addendum)
[facility] Provider Note: John Dell, MD, FACEP  CSN: [id number] MRN: [id number] ARRIVAL: 08/17/20 at 2253 ROOM: MH12/MH12   CHIEF COMPLAINT  Vomiting   HISTORY OF PRESENT ILLNESS  08/18/20 12:21 AM John Beltran is a 30 y.o. male with a history of substance abuse (cocaine, cannabis) and cannabis hyperemesis syndrome.  He is here with 2 days of vomiting which has been severe.  He has not been able to eat.  He has not had a bowel movement during that time.  He is having a lower abdominal pain which she rates as a 10 out of 10, worse with movement or palpation.  He is feeling weak and lethargic.   Past Medical History:  Diagnosis Date  . 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 Polysubstance abuse (HCC)     No past surgical history on file.  No family history on file.  Social History   Tobacco Use  . Smoking status: Former Smoker    Types: Cigarettes  . Smokeless tobacco: Never Used  Vaping Use  . Vaping Use: Never used  Substance Use Topics  . Alcohol use: No    Alcohol/week: 0.0 standard drinks  . Drug use: Yes    Types: Marijuana, Cocaine    Comment: benzodiazapine, opiates    Prior to Admission medications   Medication Sig Start Date End Date Taking? Authorizing Provider  promethazine (PHENERGAN) 25 MG suppository Place 1 suppository (25 mg total) rectally every 6 (six) hours as needed for nausea or vomiting. 08/18/20  Yes Conard Alvira, MD  promethazine (PHENERGAN) 25 MG tablet Take 1 tablet (25 mg total) by mouth every 6 (six) hours as needed for nausea or vomiting. 08/18/20   Dameka Younker, MD  famotidine (PEPCID) 20 MG tablet Take 1 tablet (20 mg total) by mouth 2 (two) times daily. Patient not taking: Reported on 07/29/2019 05/20/19 08/03/19  Alvira Monday, MD  omeprazole (PRILOSEC) 40 MG capsule Take 1 capsule at least 30 minutes prior to first daily dose of Carafate. 08/03/19 09/28/19  Tarina Volk, Jonny Ruiz, MD   potassium chloride SA (KLOR-CON) 20 MEQ tablet Take 1 tablet (20 mEq total) by mouth 2 (two) times daily. 03/19/20 08/18/20  Mekhai Venuto, Jonny Ruiz, MD  sucralfate (CARAFATE) 1 g tablet Take 1 tablet (1 g total) by mouth 4 (four) times daily -  with meals and at bedtime. 08/03/19 09/28/19  Chaden Doom, Jonny Ruiz, MD    Allergies Patient has no known allergies.   REVIEW OF SYSTEMS  Negative except as noted here or in the History of Present Illness.   PHYSICAL EXAMINATION  Initial Vital Signs Blood pressure 118/85, pulse 85, temperature 98.5 F (36.9 C), temperature source Oral, resp. rate 18, SpO2 100 %.  Examination General: Well-developed, thin male in no acute distress; appears older than age of record HENT: normocephalic; atraumatic Eyes: pupils equal, round and reactive to light; extraocular muscles intact Neck: supple Heart: regular rate and rhythm with frequent ectopy Lungs: clear to auscultation bilaterally Abdomen: soft; nondistended; lower abdominal tenderness; bowel sounds present Extremities: No deformity; full range of motion; pulses normal Neurologic: Awake but lethargic; motor function intact in all extremities and symmetric; no facial droop Skin: Warm and dry Psychiatric: Flat affect   RESULTS  Summary of this visit's results, reviewed and interpreted by myself:   EKG Interpretation  Date/Time:  Tuesday August 18 2020 00:37:14 EDT Ventricular Rate:  51 PR Interval:    QRS Duration: 100 QT Interval:  454 QTC Calculation: 419 R Axis:   87 Text Interpretation: Sinus rhythm Ventricular trigeminy Borderline short PR interval Right atrial enlargement Repol abnrm, probable ischemia, anterior lds Previously sinus tachycardia with prolonged QT Confirmed by Paula Libra ([id number]) on 08/18/2020 12:41:43 AM      Laboratory Studies: Results for orders placed or performed during the hospital encounter of 08/17/20 (from the past 24 hour(s))  Lipase, blood     Status: None   Collection Time:  08/17/20 11:30 PM  Result Value Ref Range   Lipase 28 11 - 51 U/L  Comprehensive metabolic panel     Status: Abnormal   Collection Time: 08/17/20 11:30 PM  Result Value Ref Range   Sodium 141 135 - 145 mmol/L   Potassium 3.5 3.5 - 5.1 mmol/L   Chloride 97 (L) 98 - 111 mmol/L   CO2 27 22 - 32 mmol/L   Glucose, Bld 102 (H) 70 - 99 mg/dL   BUN 37 (H) 6 - 20 mg/dL   Creatinine, Ser 9.37 (H) 0.61 - 1.24 mg/dL   Calcium 16.9 (H) 8.9 - 10.3 mg/dL   Total Protein 67.8 (H) 6.5 - 8.1 g/dL   Albumin 6.0 (H) 3.5 - 5.0 g/dL   AST 31 15 - 41 U/L   ALT 18 0 - 44 U/L   Alkaline Phosphatase 94 38 - 126 U/L   Total Bilirubin 0.9 0.3 - 1.2 mg/dL   GFR, Estimated >93 >81 mL/min   Anion gap 17 (H) 5 - 15  CBC     Status: Abnormal   Collection Time: 08/17/20 11:30 PM  Result Value Ref Range   WBC 7.0 4.0 - 10.5 K/uL   RBC 7.01 (H) 4.22 - 5.81 MIL/uL   Hemoglobin 18.7 (H) 13.0 - 17.0 g/dL   HCT 01.7 (H) 51.0 - 25.8 %   MCV 80.7 80.0 - 100.0 fL   MCH 26.7 26.0 - 34.0 pg   MCHC 33.0 30.0 - 36.0 g/dL   RDW 52.7 (H) 78.2 - 42.3 %   Platelets 328 150 - 400 K/uL   nRBC 0.0 0.0 - 0.2 %   Imaging Studies: No results found.  ED COURSE and MDM  Nursing notes, initial and subsequent vitals signs, including pulse oximetry, reviewed and interpreted by myself.  Vitals:   08/18/20 0115 08/18/20 0130 08/18/20 0255 08/18/20 0509  BP:  (!) 99/53 (!) 100/50 (!) 94/53  Pulse: (!) 51 (!) 50 (!) 59 (!) 54  Resp: (!) 23 20 20  (!) 24  Temp:      TempSrc:      SpO2: 99% 100% 98% 99%   Medications  ondansetron (ZOFRAN) injection 4 mg (4 mg Intravenous Given 08/17/20 2335)  lactated ringers bolus 2,000 mL (0 mLs Intravenous Stopped 08/18/20 0135)  haloperidol lactate (HALDOL) injection 5 mg (5 mg Intravenous Given 08/18/20 0040)   5:15 AM Patient states he is still having pain but has not vomited in the ED.  He has been sleeping comfortably after IV Haldol.  He was given 2 L of LR for rehydration.  His BUN and  creatinine are consistent with previous studies.  His elevated hemoglobin and hematocrit are likely due to hemoconcentration from dehydration.  He would like to be discharged home at this time and is declining admission.   PROCEDURES  Procedures   ED DIAGNOSES     ICD-10-CM   1. Cannabinoid hyperemesis syndrome  R11.2    F12.90   2. Dehydration  E86.0   3. PVCs (  premature ventricular contractions)  I49.3        Kwinton Maahs, MD 08/18/20 8032    Paula Libra, MD 08/18/20 [phone]

## 2020-11-04 ENCOUNTER — Other Ambulatory Visit: Payer: Self-pay

## 2020-11-04 ENCOUNTER — Encounter (HOSPITAL_COMMUNITY): Payer: Self-pay | Admitting: Emergency Medicine

## 2020-11-04 ENCOUNTER — Emergency Department (HOSPITAL_COMMUNITY)
Admission: EM | Admit: 2020-11-04 | Discharge: 2020-11-05 | Disposition: A | Discharge status: Home / Self Care | Payer: Self-pay | Attending: Emergency Medicine | Admitting: Emergency Medicine

## 2020-11-04 DIAGNOSIS — E876 Hypokalemia: Secondary | ICD-10-CM | POA: Insufficient documentation

## 2020-11-04 DIAGNOSIS — K5900 Constipation, unspecified: Secondary | ICD-10-CM | POA: Insufficient documentation

## 2020-11-04 DIAGNOSIS — Z87891 Personal history of nicotine dependence: Secondary | ICD-10-CM | POA: Insufficient documentation

## 2020-11-04 DIAGNOSIS — R1084 Generalized abdominal pain: Secondary | ICD-10-CM | POA: Insufficient documentation

## 2020-11-04 DIAGNOSIS — R1110 Vomiting, unspecified: Secondary | ICD-10-CM | POA: Insufficient documentation

## 2020-11-04 LAB — COMPREHENSIVE METABOLIC PANEL
ALT: 17 U/L (ref 0–44)
AST: 19 U/L (ref 15–41)
Albumin: 4.4 g/dL (ref 3.5–5.0)
Alkaline Phosphatase: 92 U/L (ref 38–126)
Anion gap: 13 (ref 5–15)
BUN: 23 mg/dL — ABNORMAL HIGH (ref 6–20)
CO2: 32 mmol/L (ref 22–32)
Calcium: 9.2 mg/dL (ref 8.9–10.3)
Chloride: 90 mmol/L — ABNORMAL LOW (ref 98–111)
Creatinine, Ser: 1.11 mg/dL (ref 0.61–1.24)
GFR, Estimated: >60 mL/min (ref >60)
Glucose, Bld: 114 mg/dL — ABNORMAL HIGH (ref 70–99)
Potassium: 2.8 mmol/L — ABNORMAL LOW (ref 3.5–5.1)
Sodium: 135 mmol/L (ref 135–145)
Total Bilirubin: 1.2 mg/dL (ref 0.3–1.2)
Total Protein: 8.1 g/dL (ref 6.5–8.1)

## 2020-11-04 LAB — CBC WITH DIFFERENTIAL/PLATELET
Abs Immature Granulocytes: 0.01 10*3/uL (ref 0.00–0.07)
Basophils Absolute: 0 10*3/uL (ref 0.0–0.1)
Basophils Relative: 0 %
Eosinophils Absolute: 0 10*3/uL (ref 0.0–0.5)
Eosinophils Relative: 0 %
HCT: 52.9 % — ABNORMAL HIGH (ref 39.0–52.0)
Hemoglobin: 17.9 g/dL — ABNORMAL HIGH (ref 13.0–17.0)
Immature Granulocytes: 0 %
Lymphocytes Relative: 26 %
Lymphs Abs: 1.6 10*3/uL (ref 0.7–4.0)
MCH: 27.3 pg (ref 26.0–34.0)
MCHC: 33.8 g/dL (ref 30.0–36.0)
MCV: 80.6 fL (ref 80.0–100.0)
Monocytes Absolute: 0.6 10*3/uL (ref 0.1–1.0)
Monocytes Relative: 10 %
Neutro Abs: 3.8 10*3/uL (ref 1.7–7.7)
Neutrophils Relative %: 64 %
Platelets: 282 10*3/uL (ref 150–400)
RBC: 6.56 MIL/uL — ABNORMAL HIGH (ref 4.22–5.81)
RDW: 14.4 % (ref 11.5–15.5)
WBC: 6.1 10*3/uL (ref 4.0–10.5)
nRBC: 0 % (ref 0.0–0.2)

## 2020-11-04 LAB — LIPASE, BLOOD: Lipase: 26 U/L (ref 11–51)

## 2020-11-04 NOTE — ED Provider Notes (Signed)
Emergency Medicine Provider Triage Evaluation Note  John Beltran , a 30 y.o. male  was evaluated in triage.  Pt complains of pain in his "intestines" for the past 3 days.  Also reporting vomiting and constipation.  Has not tried any medications to help with symptoms.  No diarrhea.  Review of Systems  Positive: Abdominal pain, vomiting, constipation Negative: Diarrhea  Physical Exam  BP (!) 128/93 (BP Location: Right Arm)   Pulse 79   Temp 98.3 F (36.8 C) (Oral)   Resp 15   SpO2 94%  Gen:   Awake, no distress   Resp:  Normal effort  MSK:   Moves extremities without difficulty  Other:  Generalized tenderness  Medical Decision Making  Medically screening exam initiated at 5:21 PM.  Appropriate orders placed.  Jaiel A Smethers was informed that the remainder of the evaluation will be completed by another provider, this initial triage assessment does not replace that evaluation, and the importance of remaining in the ED until their evaluation is complete.  Lab work ordered   Dietrich Pates, PA-C 11/04/20 1722    Terrilee Files, MD 11/06/20 [phone]

## 2020-11-04 NOTE — ED Triage Notes (Deleted)
Per pt, states he wants to be tested and treated for a STD-unprotected sex

## 2020-11-04 NOTE — ED Triage Notes (Signed)
Per EMS-states abdominal pain for over a week-worse 3 days-fluids given in route-pain starts only when he eats

## 2020-11-05 ENCOUNTER — Emergency Department (HOSPITAL_COMMUNITY): Payer: Self-pay

## 2020-11-05 MED ORDER — POTASSIUM CHLORIDE CRYS ER 20 MEQ PO TBCR
40.0000 meq | EXTENDED_RELEASE_TABLET | Freq: Once | ORAL | Status: AC
  Administered 2020-11-05: 40 meq via ORAL
  Filled 2020-11-05: qty 2

## 2020-11-05 MED ORDER — DICYCLOMINE HCL 20 MG PO TABS: 20.0000 mg | ORAL_TABLET | Freq: Two times a day (BID) | ORAL | 0 refills | Status: AC

## 2020-11-05 MED ORDER — HALOPERIDOL LACTATE 5 MG/ML IJ SOLN
5.0000 mg | Freq: Once | INTRAMUSCULAR | Status: AC
  Administered 2020-11-05: 5 mg via INTRAMUSCULAR
  Filled 2020-11-05: qty 1

## 2020-11-05 MED ORDER — ONDANSETRON 4 MG PO TBDP: 4.0000 mg | ORAL_TABLET | Freq: Three times a day (TID) | ORAL | 0 refills | Status: AC | PRN

## 2020-11-05 MED ORDER — POTASSIUM CHLORIDE CRYS ER 20 MEQ PO TBCR: 20.0000 meq | EXTENDED_RELEASE_TABLET | Freq: Two times a day (BID) | ORAL | 0 refills | Status: DC

## 2020-11-05 NOTE — ED Provider Notes (Signed)
[facility]-EMERGENCY DEPT Provider Note   CSN: [id number] Arrival date & time: 11/04/20  1703     History Chief Complaint  Patient presents with  . Abdominal Pain    John Beltran is a 30 y.o. male.  Patient with history of chronic/recurrent abdominal pain and vomiting presents with onset abdominal pain, "my intestines hurt", for the past 3 days. No fever. No bowel movement in 5 days. He states pain started after eating chicken and that it is usually aggravated by certain foods. No hematemesis. He continues to urinate normally without decrease or dysuria. No chest pain or SOB.   The history is provided by the patient. No language interpreter was used.       Past Medical History:  Diagnosis Date  . 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 Polysubstance abuse [facility])     Patient Active Problem List   Diagnosis Date Noted  . Nausea & vomiting 06/13/2019  . ALLERGIC RHINITIS 09/29/2009    History reviewed. No pertinent surgical history.     No family history on file.  Social History   Tobacco Use  . Smoking status: Former Smoker    Types: Cigarettes  . Smokeless tobacco: Never Used  Vaping Use  . Vaping Use: Never used  Substance Use Topics  . Alcohol use: No    Alcohol/week: 0.0 standard drinks  . Drug use: Yes    Types: Marijuana, Cocaine    Comment: benzodiazapine, opiates    Home Medications Prior to Admission medications   Medication Sig Start Date End Date Taking? Authorizing Provider  famotidine (PEPCID) 20 MG tablet Take 1 tablet (20 mg total) by mouth 2 (two) times daily. Patient not taking: Reported on 07/29/2019 05/20/19 08/03/19  Alvira Monday, MD  omeprazole (PRILOSEC) 40 MG capsule Take 1 capsule at least 30 minutes prior to first daily dose of Carafate. 08/03/19 09/28/19  Molpus, Jonny Ruiz, MD  potassium chloride SA (KLOR-CON) 20 MEQ tablet Take 1 tablet (20 mEq total) by mouth 2 (two)  times daily. 03/19/20 08/18/20  Molpus, Jonny Ruiz, MD  sucralfate (CARAFATE) 1 g tablet Take 1 tablet (1 g total) by mouth 4 (four) times daily -  with meals and at bedtime. 08/03/19 09/28/19  Molpus, Jonny Ruiz, MD    Allergies    Patient has no known allergies.  Review of Systems   Review of Systems  Constitutional: Negative for chills and fever.  Respiratory: Negative.  Negative for shortness of breath.   Cardiovascular: Negative.  Negative for chest pain.  Gastrointestinal: Positive for abdominal pain, constipation, nausea and vomiting.  Genitourinary: Negative.  Negative for decreased urine volume.  Musculoskeletal: Negative.  Negative for myalgias.  Skin: Negative.   Neurological: Negative.     Physical Exam Updated Vital Signs BP (!) 131/95   Pulse 80   Temp 98.3 F (36.8 C) (Oral)   Resp 16   SpO2 97%   Physical Exam Vitals and nursing note reviewed.  Constitutional:      Appearance: He is well-developed.  HENT:     Head: Normocephalic.  Cardiovascular:     Rate and Rhythm: Normal rate and regular rhythm.     Heart sounds: No murmur heard.   Pulmonary:     Effort: Pulmonary effort is normal.     Breath sounds: Normal breath sounds. No wheezing, rhonchi or rales.  Chest:     Chest wall: No tenderness.  Abdominal:     General: Bowel  sounds are decreased.     Palpations: Abdomen is soft.     Tenderness: There is generalized abdominal tenderness. There is guarding. There is no rebound.  Musculoskeletal:        General: Normal range of motion.     Cervical back: Normal range of motion and neck supple.  Skin:    General: Skin is warm and dry.     Findings: No rash.  Neurological:     Mental Status: He is alert and oriented to person, place, and time.     ED Results / Procedures / Treatments   Labs (all labs ordered are listed, but only abnormal results are displayed) Labs Reviewed  COMPREHENSIVE METABOLIC PANEL - Abnormal; Notable for the following components:       Result Value   Potassium 2.8 (*)    Chloride 90 (*)    Glucose, Bld 114 (*)    BUN 23 (*)    All other components within normal limits  CBC WITH DIFFERENTIAL/PLATELET - Abnormal; Notable for the following components:   RBC 6.56 (*)    Hemoglobin 17.9 (*)    HCT 52.9 (*)    All other components within normal limits  LIPASE, BLOOD    EKG None  Radiology No results found.  Procedures Procedures   Medications Ordered in ED Medications  haloperidol lactate (HALDOL) injection 5 mg (has no administration in time range)    ED Course  I have reviewed the triage vital signs and the nursing notes.  Pertinent labs & imaging results that were available during my care of the patient were reviewed by me and considered in my medical decision making (see chart for details).    MDM Rules/Calculators/A&P                          Patient to ED with recurrent abdominal pain with vomiting. No fever. Also reports no bowel movement in 5 days.   Chart reviewed. He has been seen at [facility] for same. Recent laparoscopic abdominal surgery for possible intuss vs volvulus that was normal. Seen subsequent to that visit for persistent pain. This was in March.   Patient appears uncomfortable in the ED. Labs unremarkable with the exception of low postassium. He states he stopped using marijuana and does not believe this to be the cause of his recurrent pain. He does not have any medications to use at home. He has only been seen in the hospital setting without any outpatient follow up. He reports Haldol usually helps relieve symptoms.   Haldol provided by single IM injection. On recheck, he feels much better and is requesting food and drink. VSS. Will provide Bentyl, Zofran. Recommend Miralax, fiber for constipation. Will replete potassium.    Final Clinical Impression(s) / ED Diagnoses Final diagnoses:  None   1. Recurrent abdominal pain 2. Constipation 3. Hypokalemia   Rx / DC Orders ED  Discharge Orders    None       Elpidio Anis, PA-C 11/05/20 0147    Gilda Crease, MD 11/05/20 [phone]

## 2020-11-05 NOTE — Discharge Instructions (Addendum)
Recommend Miralax, found over the counter, daily to relieve constipation. Add fiber to your diet and drink lots of water which will help as well.   Take the potassium supplement twice a day for the next week to correct your low potassium level.   Use Bentyl as directed for recurrent abdominal pain, and Zofran if needed for nausea.   It is important to become established with a primary care provider to manage your recurrent symptoms. You can call the [facility] to make this appointment or call a primary care provider of your choice.

## 2020-12-01 ENCOUNTER — Encounter (HOSPITAL_COMMUNITY): Payer: Self-pay

## 2020-12-01 ENCOUNTER — Emergency Department (HOSPITAL_COMMUNITY)
Admission: EM | Admit: 2020-12-01 | Discharge: 2020-12-01 | Disposition: A | Discharge status: Home / Self Care | Payer: Self-pay | Attending: Emergency Medicine | Admitting: Emergency Medicine

## 2020-12-01 ENCOUNTER — Other Ambulatory Visit: Payer: Self-pay

## 2020-12-01 ENCOUNTER — Emergency Department (HOSPITAL_COMMUNITY): Payer: Self-pay

## 2020-12-01 DIAGNOSIS — R109 Unspecified abdominal pain: Secondary | ICD-10-CM | POA: Insufficient documentation

## 2020-12-01 DIAGNOSIS — R112 Nausea with vomiting, unspecified: Secondary | ICD-10-CM | POA: Insufficient documentation

## 2020-12-01 DIAGNOSIS — E876 Hypokalemia: Secondary | ICD-10-CM

## 2020-12-01 DIAGNOSIS — Z87891 Personal history of nicotine dependence: Secondary | ICD-10-CM | POA: Insufficient documentation

## 2020-12-01 DIAGNOSIS — E860 Dehydration: Secondary | ICD-10-CM

## 2020-12-01 LAB — COMPREHENSIVE METABOLIC PANEL
ALT: 16 U/L (ref 0–44)
AST: 23 U/L (ref 15–41)
Albumin: 4.6 g/dL (ref 3.5–5.0)
Alkaline Phosphatase: 87 U/L (ref 38–126)
Anion gap: 19 — ABNORMAL HIGH (ref 5–15)
BUN: 39 mg/dL — ABNORMAL HIGH (ref 6–20)
CO2: 32 mmol/L (ref 22–32)
Calcium: 9.9 mg/dL (ref 8.9–10.3)
Chloride: 83 mmol/L — ABNORMAL LOW (ref 98–111)
Creatinine, Ser: 1.75 mg/dL — ABNORMAL HIGH (ref 0.61–1.24)
GFR, Estimated: 54 mL/min — ABNORMAL LOW (ref >60)
Glucose, Bld: 96 mg/dL (ref 70–99)
Potassium: 3.1 mmol/L — ABNORMAL LOW (ref 3.5–5.1)
Sodium: 134 mmol/L — ABNORMAL LOW (ref 135–145)
Total Bilirubin: 1.5 mg/dL — ABNORMAL HIGH (ref 0.3–1.2)
Total Protein: 8.8 g/dL — ABNORMAL HIGH (ref 6.5–8.1)

## 2020-12-01 LAB — CBC WITH DIFFERENTIAL/PLATELET
Abs Immature Granulocytes: 0.02 10*3/uL (ref 0.00–0.07)
Basophils Absolute: 0 10*3/uL (ref 0.0–0.1)
Basophils Relative: 1 %
Eosinophils Absolute: 0 10*3/uL (ref 0.0–0.5)
Eosinophils Relative: 1 %
HCT: 54.6 % — ABNORMAL HIGH (ref 39.0–52.0)
Hemoglobin: 18.6 g/dL — ABNORMAL HIGH (ref 13.0–17.0)
Immature Granulocytes: 0 %
Lymphocytes Relative: 28 %
Lymphs Abs: 1.6 10*3/uL (ref 0.7–4.0)
MCH: 27.6 pg (ref 26.0–34.0)
MCHC: 34.1 g/dL (ref 30.0–36.0)
MCV: 81 fL (ref 80.0–100.0)
Monocytes Absolute: 0.6 10*3/uL (ref 0.1–1.0)
Monocytes Relative: 11 %
Neutro Abs: 3.5 10*3/uL (ref 1.7–7.7)
Neutrophils Relative %: 59 %
Platelets: 332 10*3/uL (ref 150–400)
RBC: 6.74 MIL/uL — ABNORMAL HIGH (ref 4.22–5.81)
RDW: 14.6 % (ref 11.5–15.5)
WBC: 5.8 10*3/uL (ref 4.0–10.5)
nRBC: 0 % (ref 0.0–0.2)

## 2020-12-01 LAB — LIPASE, BLOOD: Lipase: 39 U/L (ref 11–51)

## 2020-12-01 LAB — MAGNESIUM: Magnesium: 2.9 mg/dL — ABNORMAL HIGH (ref 1.7–2.4)

## 2020-12-01 MED ORDER — HALOPERIDOL LACTATE 5 MG/ML IJ SOLN
2.5000 mg | Freq: Once | INTRAMUSCULAR | Status: AC
  Administered 2020-12-01: 2.5 mg via INTRAVENOUS
  Filled 2020-12-01: qty 1

## 2020-12-01 MED ORDER — LACTATED RINGERS IV BOLUS
1000.0000 mL | Freq: Once | INTRAVENOUS | Status: AC
  Administered 2020-12-01: 1000 mL via INTRAVENOUS

## 2020-12-01 MED ORDER — SODIUM CHLORIDE 0.9 % IV BOLUS: 1000.0000 mL | Freq: Once | INTRAVENOUS | Status: DC

## 2020-12-01 MED ORDER — POTASSIUM CHLORIDE CRYS ER 20 MEQ PO TBCR
40.0000 meq | EXTENDED_RELEASE_TABLET | Freq: Once | ORAL | Status: AC
  Administered 2020-12-01: 40 meq via ORAL
  Filled 2020-12-01: qty 2

## 2020-12-01 MED ORDER — POTASSIUM CHLORIDE ER 10 MEQ PO TBCR: 10.0000 meq | EXTENDED_RELEASE_TABLET | Freq: Two times a day (BID) | ORAL | 0 refills | Status: AC

## 2020-12-01 MED ORDER — METOCLOPRAMIDE HCL 5 MG/ML IJ SOLN
10.0000 mg | Freq: Once | INTRAMUSCULAR | Status: DC
  Filled 2020-12-01: qty 2

## 2020-12-01 NOTE — ED Notes (Signed)
Pt brought to room by this nurse. Pt uncooperative with assessment.

## 2020-12-01 NOTE — ED Notes (Signed)
Pt is asking for something to eat advised would need to speak with the provider reference same. Pt is also talking and asking about leaving advised pt he was not up for d/c and if he wanted to leave it would have to be AMA

## 2020-12-01 NOTE — ED Provider Notes (Signed)
Is a [facility] EMERGENCY DEPARTMENT Provider Note   CSN: [id number] Arrival date & time: 12/01/20  1234     History Chief Complaint  Patient presents with  . Abdominal Pain    John Beltran is a 30 y.o. male.  HPI 30 year old male presents with abdominal pain and vomiting.  Ongoing for about 2 days.  No diarrhea or bowel movement.  Feels like his intestines are twisted.  He has had numerous prior abdominal pains with vomiting similar to this over the last 2 years.  He has never been told what the cause was.  This feels similar to those.  No fevers, chest pain, shortness of breath.  He has not tried any medicines.  Past Medical History:  Diagnosis Date  . 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 Polysubstance abuse [facility])     Patient Active Problem List   Diagnosis Date Noted  . Nausea & vomiting 06/13/2019  . ALLERGIC RHINITIS 09/29/2009    No past surgical history on file.     No family history on file.  Social History   Tobacco Use  . Smoking status: Former    Pack years: 0.00    Types: Cigarettes  . Smokeless tobacco: Never  Vaping Use  . Vaping Use: Never used  Substance Use Topics  . Alcohol use: No    Alcohol/week: 0.0 standard drinks  . Drug use: Yes    Types: Marijuana, Cocaine    Comment: benzodiazapine, opiates    Home Medications Prior to Admission medications   Medication Sig Start Date End Date Taking? Authorizing Provider  dicyclomine (BENTYL) 20 MG tablet Take 1 tablet (20 mg total) by mouth 2 (two) times daily. 11/05/20   Elpidio Anis, PA-C  ondansetron (ZOFRAN ODT) 4 MG disintegrating tablet Take 1 tablet (4 mg total) by mouth every 8 (eight) hours as needed for nausea or vomiting. 11/05/20   Elpidio Anis, PA-C  potassium chloride SA (KLOR-CON) 20 MEQ tablet Take 1 tablet (20 mEq total) by mouth 2 (two) times daily. 11/05/20   Elpidio Anis, PA-C  famotidine (PEPCID) 20 MG tablet  Take 1 tablet (20 mg total) by mouth 2 (two) times daily. Patient not taking: Reported on 07/29/2019 05/20/19 08/03/19  Alvira Monday, MD  omeprazole (PRILOSEC) 40 MG capsule Take 1 capsule at least 30 minutes prior to first daily dose of Carafate. 08/03/19 09/28/19  Molpus, Jonny Ruiz, MD  sucralfate (CARAFATE) 1 g tablet Take 1 tablet (1 g total) by mouth 4 (four) times daily -  with meals and at bedtime. 08/03/19 09/28/19  Molpus, Jonny Ruiz, MD    Allergies    Patient has no known allergies.  Review of Systems   Review of Systems  Constitutional:  Negative for fever.  Respiratory:  Negative for shortness of breath.   Cardiovascular:  Negative for chest pain.  Gastrointestinal:  Positive for abdominal pain, nausea and vomiting. Negative for diarrhea.  All other systems reviewed and are negative.  Physical Exam Updated Vital Signs BP (!) 156/97   Pulse (!) 103   Temp 98.4 F (36.9 C) (Oral)   Resp 18   SpO2 100%   Physical Exam Vitals and nursing note reviewed.  Constitutional:      General: He is not in acute distress.    Appearance: He is well-developed. He is not ill-appearing or diaphoretic.  HENT:     Head: Normocephalic and atraumatic.     Right Ear: External  ear normal.     Left Ear: External ear normal.     Nose: Nose normal.  Eyes:     General:        Right eye: No discharge.        Left eye: No discharge.  Cardiovascular:     Rate and Rhythm: Normal rate and regular rhythm.     Heart sounds: Normal heart sounds.  Pulmonary:     Effort: Pulmonary effort is normal.     Breath sounds: Normal breath sounds.  Abdominal:     Palpations: Abdomen is soft.     Tenderness: There is generalized abdominal tenderness (mild).  Musculoskeletal:     Cervical back: Neck supple.  Skin:    General: Skin is warm and dry.  Neurological:     Mental Status: He is alert.  Psychiatric:        Mood and Affect: Mood is not anxious.    ED Results / Procedures / Treatments   Labs (all  labs ordered are listed, but only abnormal results are displayed) Labs Reviewed  CBC WITH DIFFERENTIAL/PLATELET - Abnormal; Notable for the following components:      Result Value   RBC 6.74 (*)    Hemoglobin 18.6 (*)    HCT 54.6 (*)    All other components within normal limits  COMPREHENSIVE METABOLIC PANEL - Abnormal; Notable for the following components:   Sodium 134 (*)    Potassium 3.1 (*)    Chloride 83 (*)    BUN 39 (*)    Creatinine, Ser 1.75 (*)    Total Protein 8.8 (*)    Total Bilirubin 1.5 (*)    GFR, Estimated 54 (*)    Anion gap 19 (*)    All other components within normal limits  LIPASE, BLOOD  URINALYSIS, ROUTINE W REFLEX MICROSCOPIC  MAGNESIUM    EKG EKG Interpretation  Date/Time:  Tuesday December 01 2020 14:23:56 EDT Ventricular Rate:  89 PR Interval:  117 QRS Duration: 99 QT Interval:  370 QTC Calculation: 451 R Axis:   91 Text Interpretation: Sinus rhythm Borderline short PR interval Right atrial enlargement T wave changes similar to Aug 2021 Confirmed by Pricilla Loveless [phone]) on 12/01/2020 2:28:12 PM  Radiology DG ABD ACUTE 2+V W 1V CHEST  Result Date: 12/01/2020 CLINICAL DATA:  Acute generalized abdominal pain, vomiting. EXAM: DG ABDOMEN ACUTE WITH 1 VIEW CHEST COMPARISON:  November 05, 2020. FINDINGS: There is no evidence of dilated bowel loops or free intraperitoneal air. No radiopaque calculi or other significant radiographic abnormality is seen. Heart size and mediastinal contours are within normal limits. Both lungs are clear. IMPRESSION: Negative abdominal radiographs.  No acute cardiopulmonary disease. Electronically Signed   By: Lupita Raider M.D.   On: 12/01/2020 15:07    Procedures Procedures   Medications Ordered in ED Medications  sodium chloride 0.9 % bolus 1,000 mL (0 mLs Intravenous Hold 12/01/20 1455)  potassium chloride SA (KLOR-CON) CR tablet 40 mEq (has no administration in time range)  haloperidol lactate (HALDOL) injection 2.5 mg  (2.5 mg Intravenous Given 12/01/20 1542)  lactated ringers bolus 1,000 mL (1,000 mLs Intravenous New Bag/Given 12/01/20 1543)    ED Course  I have reviewed the triage vital signs and the nursing notes.  Pertinent labs & imaging results that were available during my care of the patient were reviewed by me and considered in my medical decision making (see chart for details).    MDM Rules/Calculators/A&P  Patient with recurrent abdominal pain.  Chart has been reviewed and he did have a surgery a few months ago questionable volvulus/intussusception but it does not seem like there were significant findings on surgery.  Otherwise, he has a benign abdomen on exam here.  He does have some bump in his creatinine from his baseline.  We will give fluids and potassium and Haldol.  If he improves and is able to tolerate p.o. I think he can be discharged home to continue oral hydration.  If not he may need admission.  Care transferred to Dr. Rubin Payor. Final Clinical Impression(s) / ED Diagnoses Final diagnoses:  None    Rx / DC Orders ED Discharge Orders     None        Pricilla Loveless, MD 12/01/20 1547

## 2020-12-01 NOTE — ED Notes (Signed)
Provided something to eat at this time

## 2020-12-01 NOTE — ED Triage Notes (Signed)
Patient complains of abdominal pain x 2 days with nausea and vomiting, denies diarrhea. States that his intestines are knotted up. Patient denies fever, no chills. NAD

## 2020-12-01 NOTE — ED Provider Notes (Signed)
Emergency Medicine Provider Triage Evaluation Note  OMIR COOPRIDER , a 30 y.o. male  was evaluated in triage.  Pt complains of abd pain, nv. Denies diarrhea or constipation. Sxs ongoing x2 days. Hx intussusception.  Review of Systems  Positive: Abd pain, nv Negative: fever  Physical Exam  BP (!) 156/97   Pulse (!) 103   Temp 98.4 F (36.9 C) (Oral)   Resp 18   SpO2 100%  Gen:   Awake, no distress   Resp:  Normal effort  MSK:   Moves extremities without difficulty  Other:  Diffuse abd ttp  Medical Decision Making  Medically screening exam initiated at 12:55 PM.  Appropriate orders placed.  Vannak A Craigie was informed that the remainder of the evaluation will be completed by another provider, this initial triage assessment does not replace that evaluation, and the importance of remaining in the ED until their evaluation is complete.   Rayne Du 12/01/20 1256    Gwyneth Sprout, MD 12/03/20 1235

## 2020-12-01 NOTE — Care Management (Addendum)
Patient noted  to be without a PCP and no health insurance.  ED RNCM  met with patient  and confirmed information. Discussed [facility], and CM will notify [place].

## 2020-12-01 NOTE — ED Notes (Signed)
Received verbal report from Billy M RN at this time 

## 2021-10-14 IMAGING — CR DG ABDOMEN ACUTE W/ 1V CHEST
3 series · 3 of 3 positions shown · non-contrast
Comparison: November 05, 2020.

CLINICAL DATA: Acute generalized abdominal pain, vomiting.

EXAM:
DG ABDOMEN ACUTE WITH 1 VIEW CHEST

[chest pa]
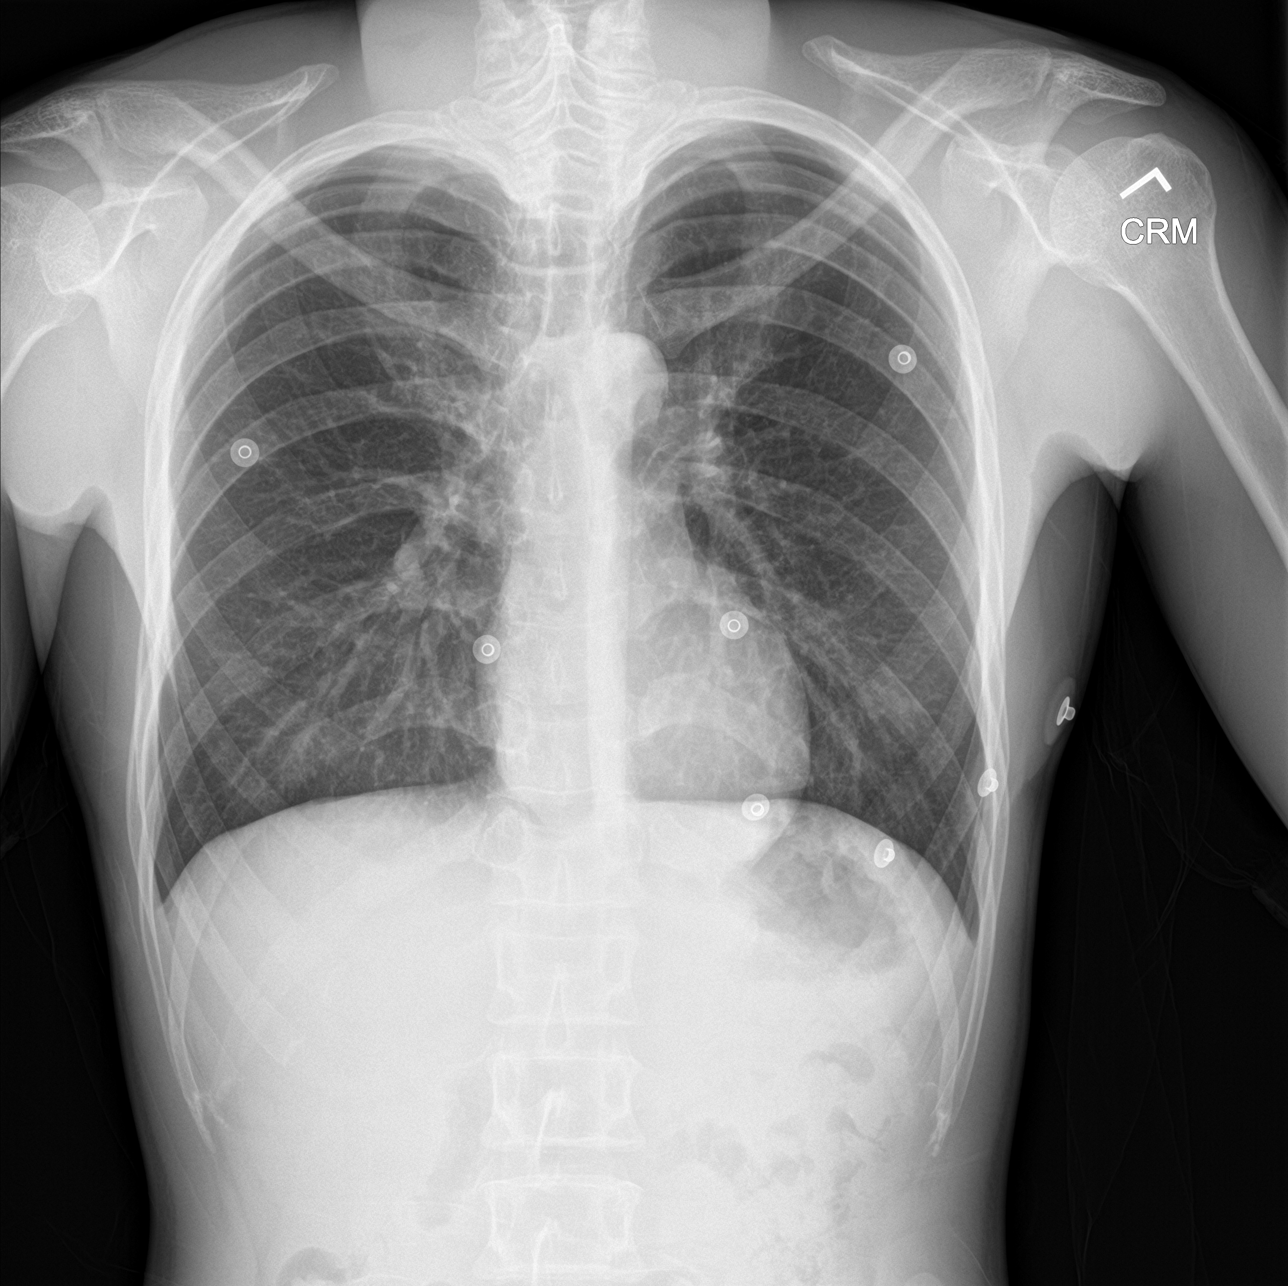

[abdomen erect]
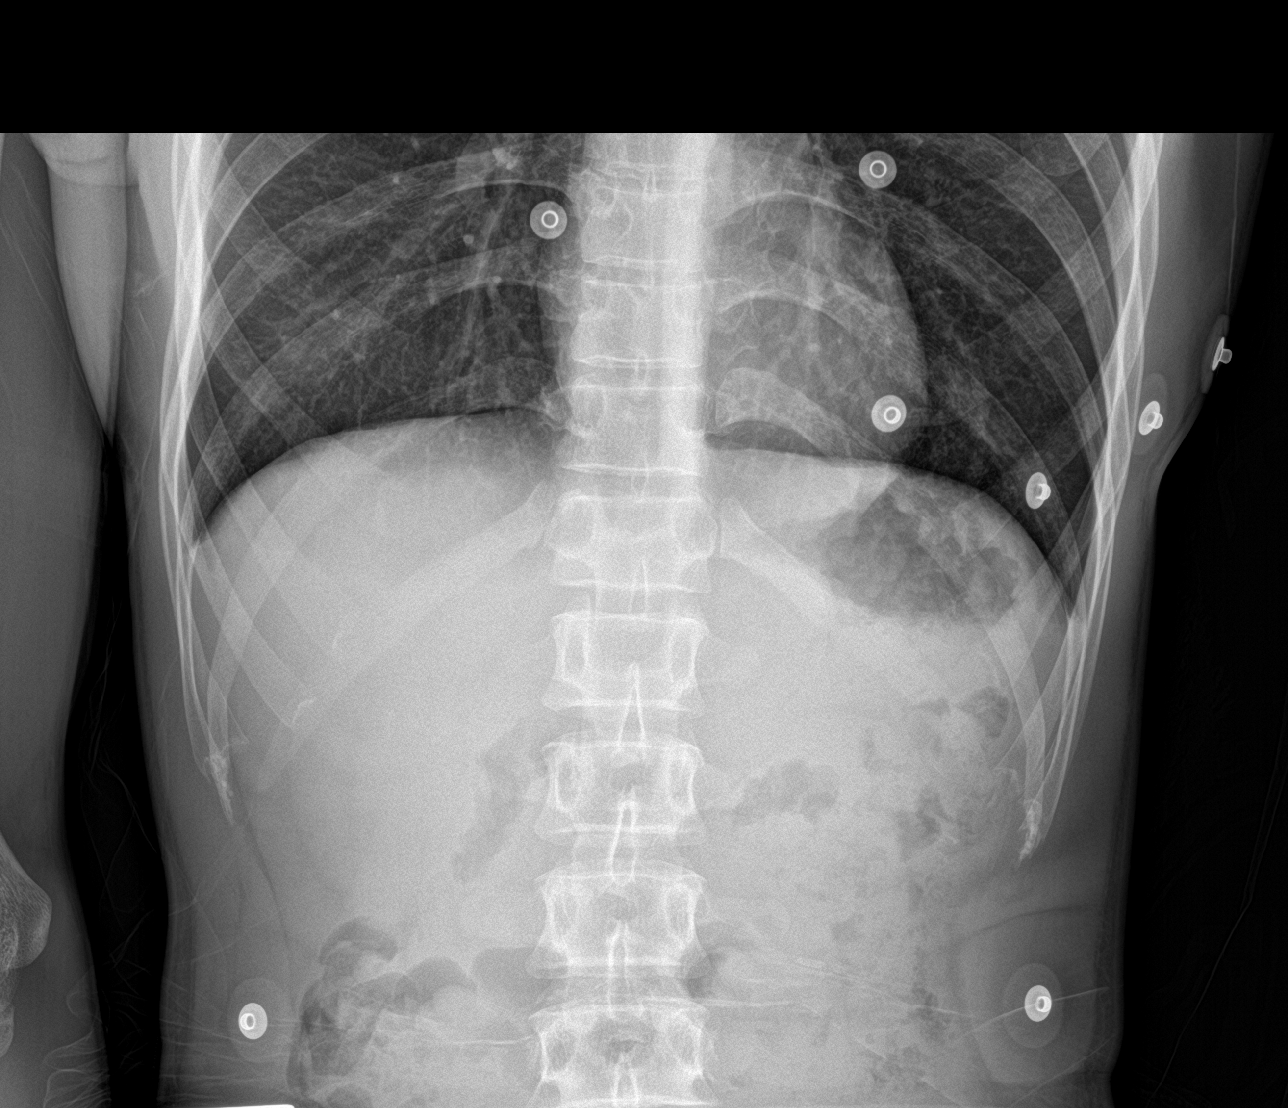

[abdomen supine]
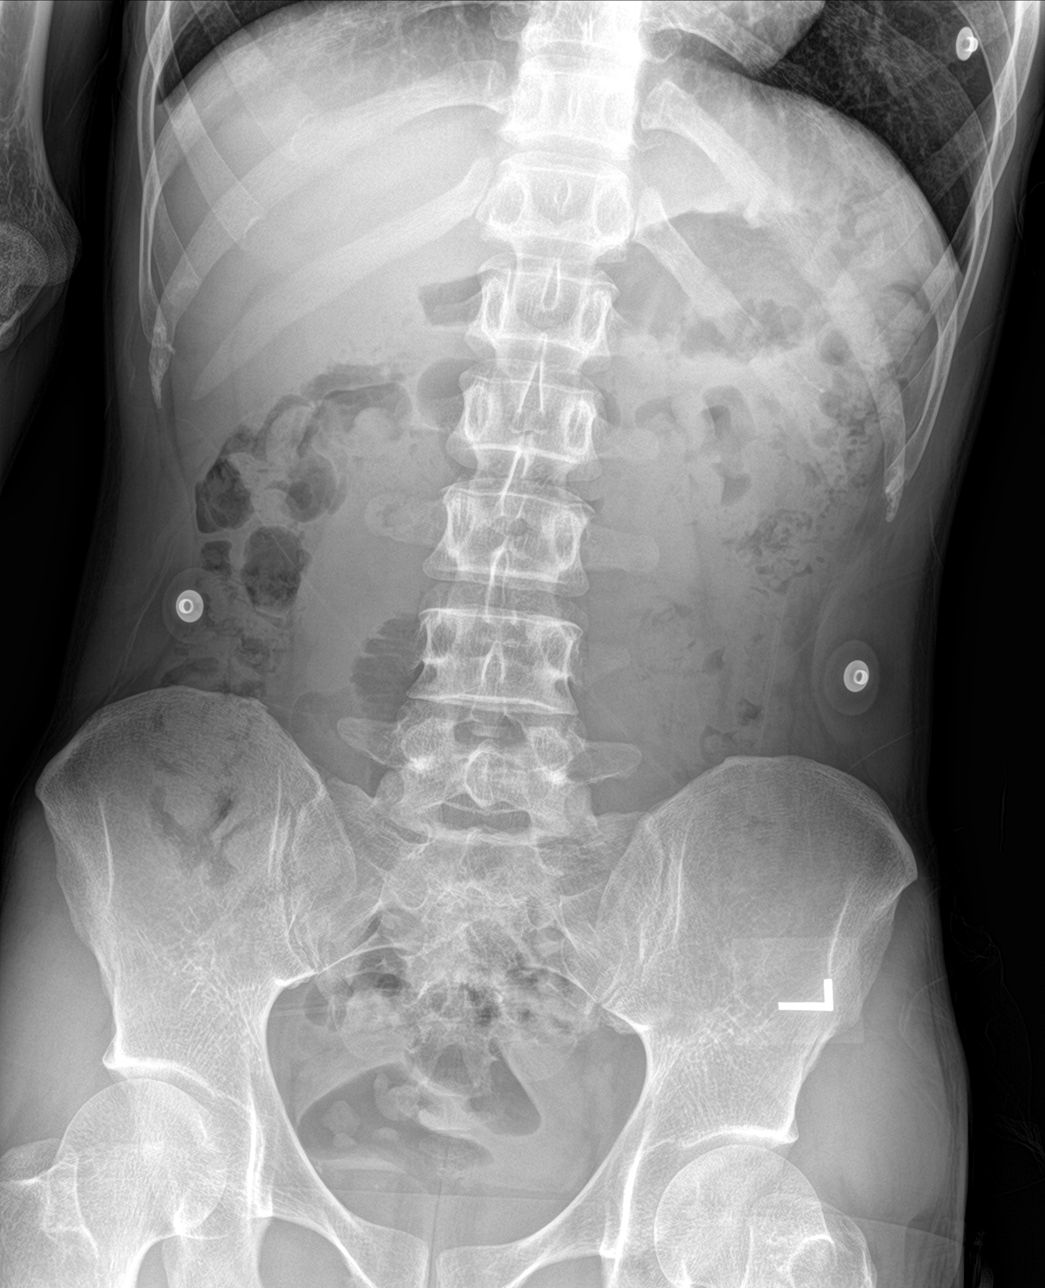

[3 of 3 positions shown; findings below may reference images not displayed]

FINDINGS: There is no evidence of dilated bowel loops or free intraperitoneal
air. No radiopaque calculi or other significant radiographic
abnormality is seen. Heart size and mediastinal contours are within
normal limits. Both lungs are clear.
IMPRESSION: Negative abdominal radiographs.  No acute cardiopulmonary disease.
